# Patient Record
Sex: Female | Born: 1980 | Race: White | Hispanic: No | Marital: Married | Smoking: Never smoker
Health system: Southern US, Community
[De-identification: ages and names within clinical notes are randomized; demographics above are authoritative.]

## PROBLEM LIST (undated history)

## (undated) ENCOUNTER — Inpatient Hospital Stay (HOSPITAL_COMMUNITY): Payer: Self-pay

## (undated) DIAGNOSIS — F32A Depression, unspecified: Secondary | ICD-10-CM

## (undated) DIAGNOSIS — F429 Obsessive-compulsive disorder, unspecified: Secondary | ICD-10-CM

## (undated) DIAGNOSIS — F329 Major depressive disorder, single episode, unspecified: Secondary | ICD-10-CM

## (undated) DIAGNOSIS — O09299 Supervision of pregnancy with other poor reproductive or obstetric history, unspecified trimester: Secondary | ICD-10-CM

## (undated) DIAGNOSIS — O24419 Gestational diabetes mellitus in pregnancy, unspecified control: Secondary | ICD-10-CM

## (undated) DIAGNOSIS — F419 Anxiety disorder, unspecified: Secondary | ICD-10-CM

## (undated) HISTORY — DX: Gestational diabetes mellitus in pregnancy, unspecified control: O24.419

## (undated) HISTORY — PX: WISDOM TOOTH EXTRACTION: SHX21

## (undated) HISTORY — DX: Supervision of pregnancy with other poor reproductive or obstetric history, unspecified trimester: O09.299

## (undated) HISTORY — PX: NO PAST SURGERIES: SHX2092

---

## 2000-01-16 ENCOUNTER — Other Ambulatory Visit: Admission: RE | Admit: 2000-01-16 | Discharge: 2000-01-16 | Payer: Self-pay | Admitting: Obstetrics and Gynecology

## 2000-12-16 ENCOUNTER — Other Ambulatory Visit: Admission: RE | Admit: 2000-12-16 | Discharge: 2000-12-16 | Payer: Self-pay | Admitting: Obstetrics and Gynecology

## 2001-12-17 ENCOUNTER — Other Ambulatory Visit: Admission: RE | Admit: 2001-12-17 | Discharge: 2001-12-17 | Payer: Self-pay | Admitting: Obstetrics and Gynecology

## 2007-02-27 ENCOUNTER — Other Ambulatory Visit: Admission: RE | Admit: 2007-02-27 | Discharge: 2007-02-27 | Payer: Self-pay | Admitting: Internal Medicine

## 2009-04-14 ENCOUNTER — Other Ambulatory Visit: Admission: RE | Admit: 2009-04-14 | Discharge: 2009-04-14 | Payer: Self-pay | Admitting: Family Medicine

## 2010-05-14 NOTE — L&D Delivery Note (Signed)
Patient was C/C/+2 and pushed for 60 minutes without epidural.   NSVD  female infant, Apgars 5,8, weight P.   The patient had one 2nd degree midline lacerations repaired with 2-0 vicryl R. Fundus was firm. EBL was about 1000 cc despite aggressive uterine massage;  Controlled with 40 units pitocin and methergine Placenta was delivered intact. Vagina was clear.  Baby was stable but had periods of apnea c/w magnesium- transferred to [facility].  Kathryn [name]

## 2010-09-08 LAB — ANTIBODY SCREEN: Antibody Screen: NEGATIVE

## 2010-09-08 LAB — ABO/RH

## 2010-09-08 LAB — HIV ANTIBODY (ROUTINE TESTING W REFLEX): HIV: NONREACTIVE

## 2010-09-08 LAB — RUBELLA ANTIBODY, IGM: Rubella: IMMUNE

## 2010-11-07 ENCOUNTER — Other Ambulatory Visit (HOSPITAL_COMMUNITY): Payer: Self-pay | Admitting: Obstetrics and Gynecology

## 2010-11-07 DIAGNOSIS — O09899 Supervision of other high risk pregnancies, unspecified trimester: Secondary | ICD-10-CM

## 2010-11-10 ENCOUNTER — Other Ambulatory Visit (HOSPITAL_COMMUNITY): Payer: Self-pay | Admitting: Obstetrics and Gynecology

## 2010-11-10 ENCOUNTER — Encounter (HOSPITAL_COMMUNITY): Payer: Self-pay

## 2010-11-10 ENCOUNTER — Ambulatory Visit (HOSPITAL_COMMUNITY)
Admission: RE | Admit: 2010-11-10 | Discharge: 2010-11-10 | Disposition: A | Discharge status: Home / Self Care | Payer: BC Managed Care – PPO | Source: Ambulatory Visit | Attending: Obstetrics and Gynecology | Admitting: Obstetrics and Gynecology

## 2010-11-10 DIAGNOSIS — IMO0001 Reserved for inherently not codable concepts without codable children: Secondary | ICD-10-CM

## 2010-11-10 DIAGNOSIS — O09899 Supervision of other high risk pregnancies, unspecified trimester: Secondary | ICD-10-CM

## 2010-11-10 DIAGNOSIS — Z363 Encounter for antenatal screening for malformations: Secondary | ICD-10-CM | POA: Insufficient documentation

## 2010-11-10 DIAGNOSIS — Z1389 Encounter for screening for other disorder: Secondary | ICD-10-CM | POA: Insufficient documentation

## 2010-11-10 DIAGNOSIS — O2690 Pregnancy related conditions, unspecified, unspecified trimester: Secondary | ICD-10-CM

## 2010-11-10 DIAGNOSIS — O358XX0 Maternal care for other (suspected) fetal abnormality and damage, not applicable or unspecified: Secondary | ICD-10-CM | POA: Insufficient documentation

## 2010-12-21 ENCOUNTER — Encounter (HOSPITAL_COMMUNITY): Payer: Self-pay

## 2010-12-21 ENCOUNTER — Ambulatory Visit (HOSPITAL_COMMUNITY)
Admission: RE | Admit: 2010-12-21 | Discharge: 2010-12-21 | Disposition: A | Discharge status: Home / Self Care | Payer: BC Managed Care – PPO | Source: Ambulatory Visit | Attending: Obstetrics and Gynecology | Admitting: Obstetrics and Gynecology

## 2010-12-21 DIAGNOSIS — Q270 Congenital absence and hypoplasia of umbilical artery: Secondary | ICD-10-CM

## 2010-12-21 DIAGNOSIS — Z363 Encounter for antenatal screening for malformations: Secondary | ICD-10-CM | POA: Insufficient documentation

## 2010-12-21 DIAGNOSIS — O358XX0 Maternal care for other (suspected) fetal abnormality and damage, not applicable or unspecified: Secondary | ICD-10-CM | POA: Insufficient documentation

## 2010-12-21 DIAGNOSIS — O2690 Pregnancy related conditions, unspecified, unspecified trimester: Secondary | ICD-10-CM

## 2010-12-21 DIAGNOSIS — IMO0001 Reserved for inherently not codable concepts without codable children: Secondary | ICD-10-CM | POA: Insufficient documentation

## 2010-12-21 DIAGNOSIS — Z1389 Encounter for screening for other disorder: Secondary | ICD-10-CM | POA: Insufficient documentation

## 2010-12-21 NOTE — Progress Notes (Signed)
Ultrasound in AS/OBGYN/EPIC.  Follow up U/S scheduled 6 weeks

## 2011-01-26 ENCOUNTER — Institutional Professional Consult (permissible substitution): Payer: BC Managed Care – PPO | Admitting: Pediatrics

## 2011-02-01 ENCOUNTER — Ambulatory Visit (HOSPITAL_COMMUNITY)
Admission: RE | Admit: 2011-02-01 | Discharge: 2011-02-01 | Disposition: A | Discharge status: Home / Self Care | Payer: BC Managed Care – PPO | Source: Ambulatory Visit | Attending: Obstetrics and Gynecology | Admitting: Obstetrics and Gynecology

## 2011-02-01 DIAGNOSIS — IMO0001 Reserved for inherently not codable concepts without codable children: Secondary | ICD-10-CM | POA: Insufficient documentation

## 2011-02-01 DIAGNOSIS — Q270 Congenital absence and hypoplasia of umbilical artery: Secondary | ICD-10-CM

## 2011-02-01 DIAGNOSIS — Z3689 Encounter for other specified antenatal screening: Secondary | ICD-10-CM | POA: Insufficient documentation

## 2011-02-01 NOTE — Progress Notes (Signed)
Report in [facility]-OBGYN/EPIC; follow-up as needed 

## 2011-02-28 ENCOUNTER — Encounter (HOSPITAL_COMMUNITY): Payer: Self-pay | Admitting: Anesthesiology

## 2011-02-28 ENCOUNTER — Encounter (HOSPITAL_COMMUNITY): Payer: Self-pay | Admitting: *Deleted

## 2011-02-28 ENCOUNTER — Inpatient Hospital Stay (HOSPITAL_COMMUNITY)
Admission: AD | Admit: 2011-02-28 | Discharge: 2011-03-05 | DRG: 372 | Disposition: A | Discharge status: Home / Self Care | Payer: BC Managed Care – PPO | Source: Ambulatory Visit | Attending: Obstetrics and Gynecology | Admitting: Obstetrics and Gynecology

## 2011-02-28 DIAGNOSIS — Q270 Congenital absence and hypoplasia of umbilical artery: Secondary | ICD-10-CM

## 2011-02-28 DIAGNOSIS — O1414 Severe pre-eclampsia complicating childbirth: Principal | ICD-10-CM | POA: Diagnosis present

## 2011-02-28 DIAGNOSIS — O1420 HELLP syndrome (HELLP), unspecified trimester: Secondary | ICD-10-CM

## 2011-02-28 HISTORY — DX: Depression, unspecified: F32.A

## 2011-02-28 HISTORY — DX: Anxiety disorder, unspecified: F41.9

## 2011-02-28 HISTORY — DX: Major depressive disorder, single episode, unspecified: F32.9

## 2011-02-28 HISTORY — DX: Obsessive-compulsive disorder, unspecified: F42.9

## 2011-02-28 LAB — URINALYSIS, ROUTINE W REFLEX MICROSCOPIC
Glucose, UA: NEGATIVE mg/dL
Ketones, ur: NEGATIVE mg/dL
Nitrite: NEGATIVE
Specific Gravity, Urine: >1.03 — ABNORMAL HIGH (ref 1.005–1.030)
pH: 6 (ref 5.0–8.0)

## 2011-02-28 LAB — TYPE AND SCREEN: ABO/RH(D): A POS

## 2011-02-28 LAB — COMPREHENSIVE METABOLIC PANEL
Albumin: 2.5 g/dL — ABNORMAL LOW (ref 3.5–5.2)
BUN: 11 mg/dL (ref 6–23)
Calcium: 9.2 mg/dL (ref 8.4–10.5)
Creatinine, Ser: 0.76 mg/dL (ref 0.50–1.10)
GFR calc Af Amer: >90 mL/min (ref >90)
Glucose, Bld: 78 mg/dL (ref 70–99)
Potassium: 4.4 mEq/L (ref 3.5–5.1)
Total Protein: 6 g/dL (ref 6.0–8.3)

## 2011-02-28 LAB — AMYLASE: Amylase: 60 U/L (ref 0–105)

## 2011-02-28 LAB — CBC
HCT: 35.5 % — ABNORMAL LOW (ref 36.0–46.0)
Hemoglobin: 12.2 g/dL (ref 12.0–15.0)
MCH: 31.9 pg (ref 26.0–34.0)
MCHC: 34.4 g/dL (ref 30.0–36.0)
RDW: 13.2 % (ref 11.5–15.5)

## 2011-02-28 LAB — ABO/RH: ABO/RH(D): A POS

## 2011-02-28 LAB — URINE MICROSCOPIC-ADD ON

## 2011-02-28 LAB — LIPASE, BLOOD: Lipase: 28 U/L (ref 11–59)

## 2011-02-28 MED ORDER — OXYTOCIN 20 UNITS IN LACTATED RINGERS INFUSION - SIMPLE
INTRAVENOUS | Status: AC
  Administered 2011-02-28: 2 m[IU]/min via INTRAVENOUS
  Filled 2011-02-28: qty 1000

## 2011-02-28 MED ORDER — OXYTOCIN 20 UNITS IN LACTATED RINGERS INFUSION - SIMPLE
125.0000 mL/h | Freq: Once | INTRAVENOUS | Status: AC
  Administered 2011-03-01: 125 mL/h via INTRAVENOUS

## 2011-02-28 MED ORDER — LIDOCAINE HCL (PF) 1 % IJ SOLN: 30.0000 mL | INTRAMUSCULAR | Status: DC | PRN

## 2011-02-28 MED ORDER — IBUPROFEN 600 MG PO TABS: 600.0000 mg | ORAL_TABLET | Freq: Four times a day (QID) | ORAL | Status: DC | PRN

## 2011-02-28 MED ORDER — PENICILLIN G POTASSIUM 5000000 UNITS IJ SOLR
5.0000 10*6.[IU] | Freq: Once | INTRAVENOUS | Status: DC
  Administered 2011-02-28: 5 10*6.[IU] via INTRAVENOUS
  Filled 2011-02-28: qty 5

## 2011-02-28 MED ORDER — ZOLPIDEM TARTRATE 10 MG PO TABS: 10.0000 mg | ORAL_TABLET | Freq: Every evening | ORAL | Status: DC | PRN

## 2011-02-28 MED ORDER — PENICILLIN G POTASSIUM 5000000 UNITS IJ SOLR
5.0000 10*6.[IU] | Freq: Once | INTRAVENOUS | Status: DC
  Filled 2011-02-28: qty 5

## 2011-02-28 MED ORDER — OXYTOCIN BOLUS FROM INFUSION
500.0000 mL | Freq: Once | INTRAVENOUS | Status: DC
  Filled 2011-02-28: qty 500
  Filled 2011-02-28 (×2): qty 1000

## 2011-02-28 MED ORDER — CITRIC ACID-SODIUM CITRATE 334-500 MG/5ML PO SOLN: 30.0000 mL | ORAL | Status: DC | PRN

## 2011-02-28 MED ORDER — ONDANSETRON HCL 4 MG/2ML IJ SOLN: 4.0000 mg | Freq: Four times a day (QID) | INTRAMUSCULAR | Status: DC | PRN

## 2011-02-28 MED ORDER — LACTATED RINGERS IV SOLN
INTRAVENOUS | Status: DC
  Administered 2011-02-28: 20:00:00 via INTRAVENOUS

## 2011-02-28 MED ORDER — LACTATED RINGERS IV SOLN: 500.0000 mL | INTRAVENOUS | Status: DC | PRN

## 2011-02-28 MED ORDER — MAGNESIUM SULFATE BOLUS VIA INFUSION
6.0000 g | Freq: Once | INTRAVENOUS | Status: DC
  Filled 2011-02-28: qty 500

## 2011-02-28 MED ORDER — PENICILLIN G POTASSIUM 5000000 UNITS IJ SOLR
2.5000 10*6.[IU] | INTRAVENOUS | Status: DC
  Administered 2011-02-28 – 2011-03-01 (×3): 2.5 10*6.[IU] via INTRAVENOUS
  Filled 2011-02-28 (×8): qty 2.5

## 2011-02-28 MED ORDER — OXYTOCIN BOLUS FROM INFUSION
500.0000 mL | Freq: Once | INTRAVENOUS | Status: DC
  Filled 2011-02-28: qty 500

## 2011-02-28 MED ORDER — ACETAMINOPHEN 325 MG PO TABS: 650.0000 mg | ORAL_TABLET | ORAL | Status: DC | PRN

## 2011-02-28 MED ORDER — LACTATED RINGERS IV SOLN: INTRAVENOUS | Status: DC

## 2011-02-28 MED ORDER — OXYTOCIN 20 UNITS IN LACTATED RINGERS INFUSION - SIMPLE: 125.0000 mL/h | Freq: Once | INTRAVENOUS | Status: DC

## 2011-02-28 MED ORDER — FLEET ENEMA 7-19 GM/118ML RE ENEM: 1.0000 | ENEMA | RECTAL | Status: DC | PRN

## 2011-02-28 MED ORDER — MAGNESIUM SULFATE BOLUS VIA INFUSION
6.0000 g | Freq: Once | INTRAVENOUS | Status: DC
Start: 2011-02-28 — End: 2011-02-28
  Filled 2011-02-28: qty 500

## 2011-02-28 MED ORDER — MAGNESIUM SULFATE 40 G IN LACTATED RINGERS - SIMPLE
2.0000 g/h | INTRAVENOUS | Status: DC
  Administered 2011-03-01: 2 g/h via INTRAVENOUS
  Filled 2011-02-28: qty 500

## 2011-02-28 MED ORDER — NALBUPHINE SYRINGE 5 MG/0.5 ML: 10.0000 mg | INJECTION | INTRAMUSCULAR | Status: DC | PRN

## 2011-02-28 MED ORDER — PENICILLIN G POTASSIUM 5000000 UNITS IJ SOLR
2.5000 10*6.[IU] | INTRAVENOUS | Status: DC
  Filled 2011-02-28 (×2): qty 2.5

## 2011-02-28 MED ORDER — OXYTOCIN 20 UNITS IN LACTATED RINGERS INFUSION - SIMPLE
1.0000 m[IU]/min | INTRAVENOUS | Status: DC
  Administered 2011-02-28: 2 m[IU]/min via INTRAVENOUS
  Administered 2011-03-01: 333.333 m[IU]/min via INTRAVENOUS
  Administered 2011-03-01: 333 m[IU]/min via INTRAVENOUS
  Filled 2011-02-28: qty 1000

## 2011-02-28 MED ORDER — CITRIC ACID-SODIUM CITRATE 334-500 MG/5ML PO SOLN
30.0000 mL | ORAL | Status: DC | PRN
  Administered 2011-03-01: 30 mL via ORAL
  Filled 2011-02-28: qty 15

## 2011-02-28 MED ORDER — TERBUTALINE SULFATE 1 MG/ML IJ SOLN: 0.2500 mg | Freq: Once | INTRAMUSCULAR | Status: AC | PRN

## 2011-02-28 MED ORDER — MAGNESIUM SULFATE 40 G IN LACTATED RINGERS - SIMPLE
2.0000 g/h | INTRAVENOUS | Status: DC
  Administered 2011-02-28: 6 g/h via INTRAVENOUS
  Filled 2011-02-28: qty 500

## 2011-02-28 MED ORDER — GI COCKTAIL ~~LOC~~
30.0000 mL | Freq: Once | ORAL | Status: AC
  Administered 2011-02-28: 30 mL via ORAL
  Filled 2011-02-28: qty 30

## 2011-02-28 MED ORDER — LIDOCAINE HCL (PF) 1 % IJ SOLN
30.0000 mL | INTRAMUSCULAR | Status: AC | PRN
  Administered 2011-03-01: 30 mL via SUBCUTANEOUS
  Filled 2011-02-28: qty 30

## 2011-02-28 MED ORDER — LACTATED RINGERS IV SOLN
500.0000 mL | INTRAVENOUS | Status: DC | PRN
  Administered 2011-03-01: 1000 mL via INTRAVENOUS

## 2011-02-28 NOTE — ED Provider Notes (Signed)
History   Pt presents today c/o epigastric pain that comes and goes. She states she has had this for the past couple of weeks and it is always located in the mid-epigastric region. She denies CP, SOB, fever, decreased fetal movement, or any other sx at this time. She was seen in the office by Dr. Henderson Cloud earlier today.  Chief Complaint  Patient presents with  . Abdominal Pain   HPI  OB History    Grav Para Term Preterm Abortions TAB SAB Ect Mult Living   1               Past Medical History  Diagnosis Date  . Anxiety   . Obsessive compulsive disorder   . Depression     Past Surgical History  Procedure Date  . Wisdom tooth extraction     No family history on file.  History  Substance Use Topics  . Smoking status: Never Smoker   . Smokeless tobacco: Never Used  . Alcohol Use: No    Allergies:  Allergies  Allergen Reactions  . Naproxen Other (See Comments)    Stomach pain  . Percocet (Oxycodone-Acetaminophen) Itching  . Sulfa Antibiotics Other (See Comments)    Childhood reaction    Prescriptions prior to admission  Medication Sig Dispense Refill  . citalopram (CELEXA) 10 MG tablet Take 20 mg by mouth daily.       . famotidine (PEPCID AC MAXIMUM STRENGTH) 20 MG tablet Take 20 mg by mouth 2 (two) times daily.       . Prenatal Vit-Fe Fum-FA-Omega (ONE-A-DAY WOMENS PRENATAL PO) Take 1 tablet by mouth daily.          Review of Systems  Constitutional: Negative for fever.  Eyes: Negative for blurred vision.  Respiratory: Negative for cough, hemoptysis, sputum production, shortness of breath and wheezing.   Cardiovascular: Negative for chest pain, palpitations, orthopnea and claudication.  Gastrointestinal: Positive for abdominal pain. Negative for nausea, vomiting, diarrhea and constipation.  Genitourinary: Negative for dysuria, urgency, frequency and hematuria.  Neurological: Negative for dizziness and headaches.  Psychiatric/Behavioral: Negative for depression  and suicidal ideas.   Physical Exam   Blood pressure 110/74, pulse 59, temperature 98.7 F (37.1 C), temperature source Oral, resp. rate 20, height 5\' 4"  (1.626 m), weight 172 lb 3.2 oz (78.109 kg), SpO2 97.00%.  Physical Exam  Nursing note and vitals reviewed. Constitutional: She is oriented to person, place, and time. She appears well-developed and well-nourished. No distress.  HENT:  Head: Normocephalic and atraumatic.  Eyes: EOM are normal. Pupils are equal, round, and reactive to light.  Cardiovascular: Normal rate, regular rhythm and normal heart sounds.  Exam reveals no gallop and no friction rub.   No murmur heard. Respiratory: Effort normal and breath sounds normal. No respiratory distress. She has no wheezes. She has no rales. She exhibits no tenderness.  GI: Soft. She exhibits no distension. There is no tenderness. There is no rebound and no guarding.  Neurological: She is alert and oriented to person, place, and time.  Skin: Skin is warm and dry. She is not diaphoretic.  Psychiatric: She has a normal mood and affect. Her behavior is normal. Judgment and thought content normal.    MAU Course  Procedures  Results for orders placed during the hospital encounter of 02/28/11 (from the past 24 hour(s))  CBC     Status: Abnormal   Collection Time   02/28/11  6:05 PM      Component Value Range  WBC 9.6  4.0 - 10.5 (K/uL)   RBC 3.82 (*) 3.87 - 5.11 (MIL/uL)   Hemoglobin 12.2  12.0 - 15.0 (g/dL)   HCT 69.6 (*) 29.5 - 46.0 (%)   MCV 92.9  78.0 - 100.0 (fL)   MCH 31.9  26.0 - 34.0 (pg)   MCHC 34.4  30.0 - 36.0 (g/dL)   RDW 28.4  13.2 - 44.0 (%)   Platelets 44 (*) 150 - 400 (K/uL)  COMPREHENSIVE METABOLIC PANEL     Status: Abnormal   Collection Time   02/28/11  6:05 PM      Component Value Range   Sodium 134 (*) 135 - 145 (mEq/L)   Potassium 4.4  3.5 - 5.1 (mEq/L)   Chloride 102  96 - 112 (mEq/L)   CO2 25  19 - 32 (mEq/L)   Glucose, Bld 78  70 - 99 (mg/dL)   BUN 11  6  - 23 (mg/dL)   Creatinine, Ser 1.02  0.50 - 1.10 (mg/dL)   Calcium 9.2  8.4 - 72.5 (mg/dL)   Total Protein 6.0  6.0 - 8.3 (g/dL)   Albumin 2.5 (*) 3.5 - 5.2 (g/dL)   AST 366 (*) 0 - 37 (U/L)   ALT 270 (*) 0 - 35 (U/L)   Alkaline Phosphatase 132 (*) 39 - 117 (U/L)   Total Bilirubin 0.5  0.3 - 1.2 (mg/dL)   GFR calc non Af Amer >90  >90 (mL/min)   GFR calc Af Amer >90  >90 (mL/min)    Discussed with Dr. Tenny Craw. Will admit for delivery. Assessment and Plan  HELLP: admit.  Clinton Gallant. Rice III, DrHSc, MPAS, PA-C  02/28/2011, 6:37 PM   Henrietta Hoover, PA 02/28/11 1908

## 2011-02-28 NOTE — H&P (Addendum)
Kathryn [name] is a 30 y.o. G1P0  female at 35+4 presenting for epigastric pain One week ago the patient noted acute onset of central epigastric pain and chest pain.  These symptoms eventually subsided but she has had recurrent episodes of epigastric pain on/off since then.  She was experiencing that again today and was seen for a routine appointment earlier today. She was sent to MAU for evaluation.  In [place] BPs were normotensive, however her LFTs were elevated and her platelets were 44K c/w HELLP Syndrome. Pt is also noted to be contracting and her cervix was 3 cm in the office.. She is admitted for augmentation of labor for HELLP.  History OB History    Grav Para Term Preterm Abortions TAB SAB Ect Mult Living   1              Past Medical History  Diagnosis Date  . Anxiety   . Obsessive compulsive disorder   . Depression    Past Surgical History  Procedure Date  . Wisdom tooth extraction    Family History: family history is not on file. Social History:  reports that she has never smoked. She has never used smokeless tobacco. She reports that she does not drink alcohol or use illicit drugs.  ROS: As above  Filed Vitals:   02/28/11 2000  BP: 126/79  Pulse: 68  Temp: 98.5 F (36.9 C)  Resp: 20     Exam Physical Exam   AOX3, NAD Gravid, soft, NT/ND FHT 140-150 + accels, no decels, reactive Cvx: 3/80/-2 Toco: Q 2-3minutes  Prenatal labs: ABO, Rh:  A+ Antibody:  Negative Rubella:  Immune RPR:   NR HBsAg:   Neg HIV:   NR GBS:   unknown  Pregnancy has been complicated by 2VC.  Fetal Echo was WNL as was an anatomic survey.  Serial growth ultrasounds have shown good fetal growth.  Pt has also had depression and anxiety for which she is currently taking celexa 20mg .  Results for JADAH, BOBAK (MRN [id number]) as of 02/28/2011 20:33  Ref. Range 02/28/2011 18:05  Sodium Latest Range: 135-145 mEq/L 134 (L)  Potassium Latest Range: 3.5-5.1 mEq/L 4.4  Chloride Latest  Range: 96-112 mEq/L 102  CO2 Latest Range: 19-32 mEq/L 25  BUN Latest Range: 6-23 mg/dL 11  Creat Latest Range: 0.50-1.10 mg/dL 4.09  Calcium Latest Range: 8.4-10.5 mg/dL 9.2  GFR calc non Af Amer Latest Range: >90 mL/min >90  GFR calc Af Amer Latest Range: >90 mL/min >90  Glucose Latest Range: 70-99 mg/dL 78  Alkaline Phosphatase Latest Range: 39-117 U/L 132 (H)  Albumin Latest Range: 3.5-5.2 g/dL 2.5 (L)  Amylase Latest Range: 0-105 U/L 60  Lipase Latest Range: 11-59 U/L 28  AST Latest Range: 0-37 U/L 189 (H)  ALT Latest Range: 0-35 U/L 270 (H)  Total Protein Latest Range: 6.0-8.3 g/dL 6.0  Total Bilirubin Latest Range: 0.3-1.2 mg/dL 0.5    Assessment/Plan: 30 yo G1P0 @ 35+4 for augmentation of labor for HELLP 1) Admit 2) Magnesium Sulfate for seizure prophylaxis 3) Pitocin for augmentation of labor 4) PCN for unknown GBS   Kamia Insalaco H. 02/28/2011, 8:21 PM

## 2011-02-28 NOTE — Progress Notes (Signed)
Doula at bedside.

## 2011-02-28 NOTE — Progress Notes (Signed)
Report called to birthing suites charge RN per Erenest Blank, RN. Pt may go to room 165 in 5-10 min.

## 2011-02-28 NOTE — Progress Notes (Signed)
Pt states that last week on Wednesday and Thursday she had epigastric pain that went away. Started again this am and had the patient in tears. Pain is intense and radiates into the back. Some nausea but no vomiting. No bleeding or leaking and reports good fetal movement.

## 2011-02-28 NOTE — Anesthesia Preprocedure Evaluation (Signed)
Anesthesia Evaluation  Name, MR# and DOB Patient awake  General Assessment Comment  Reviewed: Allergy & Precautions, H&P , NPO status , Patient's Chart, lab work & pertinent test results  Airway Mallampati: II TM Distance: >3 FB Neck ROM: Full    Dental No notable dental hx. (+) Teeth Intact and Caps   Pulmonary  clear to auscultation  Pulmonary exam normal       Cardiovascular Regular Normal    Neuro/Psych PSYCHIATRIC DISORDERS Anxiety Depression OCDNegative Neurological ROS     GI/Hepatic GERD Medicated and Poorly ControlledElevated LFT's today. No prior hx/o liver problems. GERD with pregnancy. Poor control on Pepcid AC   Endo/Other  Negative Endocrine ROS  Renal/GU negative Renal ROS  Genitourinary negative   Musculoskeletal negative musculoskeletal ROS (+)   Abdominal (+)  Abdomen: soft.    Peds negative pediatric ROS (+)  Hematology negative hematology ROS (+)   Anesthesia Other Findings   Reproductive/Obstetrics (+) Pregnancy                           Anesthesia Physical Anesthesia Plan  ASA: III  Anesthesia Plan: General   Post-op Pain Management:    Induction: Intravenous, Rapid sequence and Cricoid pressure planned  Airway Management Planned: Oral ETT  Additional Equipment:   Intra-op Plan:   Post-operative Plan: Extubation in OR  Informed Consent:   Dental advisory given  Plan Discussed with: CRNA, Anesthesiologist and Surgeon  Anesthesia Plan Comments: (Discussed GA with patient in detail, if C/Section occurs. Patient's plan is to deliver vaginally. Risks, benefits and alternatives of General Anesthesia discussed in detail with the patient and her husband. They appear to understand. Questions were answered.)        Anesthesia Quick Evaluation

## 2011-02-28 NOTE — Progress Notes (Signed)
Pt to room 165 at this time.

## 2011-03-01 ENCOUNTER — Encounter (HOSPITAL_COMMUNITY): Payer: Self-pay

## 2011-03-01 LAB — MAGNESIUM: Magnesium: 5.9 mg/dL — ABNORMAL HIGH (ref 1.5–2.5)

## 2011-03-01 LAB — CBC
HCT: 35.8 % — ABNORMAL LOW (ref 36.0–46.0)
HCT: 28.4 % — ABNORMAL LOW (ref 36.0–46.0)
Hemoglobin: 9.7 g/dL — ABNORMAL LOW (ref 12.0–15.0)
MCHC: 34.2 g/dL (ref 30.0–36.0)
RBC: 3.08 MIL/uL — ABNORMAL LOW (ref 3.87–5.11)
RDW: 13.5 % (ref 11.5–15.5)
WBC: 10.5 10*3/uL (ref 4.0–10.5)

## 2011-03-01 LAB — RPR: RPR Ser Ql: NONREACTIVE

## 2011-03-01 LAB — BASIC METABOLIC PANEL
BUN: 8 mg/dL (ref 6–23)
Chloride: 99 mEq/L (ref 96–112)
GFR calc Af Amer: >90 mL/min (ref >90)
Potassium: 4.2 mEq/L (ref 3.5–5.1)
Sodium: 132 mEq/L — ABNORMAL LOW (ref 135–145)

## 2011-03-01 LAB — COMPREHENSIVE METABOLIC PANEL
ALT: 344 U/L — ABNORMAL HIGH (ref 0–35)
Alkaline Phosphatase: 109 U/L (ref 39–117)
BUN: 7 mg/dL (ref 6–23)
CO2: 24 mEq/L (ref 19–32)
Chloride: 98 mEq/L (ref 96–112)
GFR calc Af Amer: >90 mL/min (ref >90)
GFR calc non Af Amer: >90 mL/min (ref >90)
Glucose, Bld: 130 mg/dL — ABNORMAL HIGH (ref 70–99)
Potassium: 3.9 mEq/L (ref 3.5–5.1)
Sodium: 130 mEq/L — ABNORMAL LOW (ref 135–145)
Total Bilirubin: 1 mg/dL (ref 0.3–1.2)

## 2011-03-01 LAB — MRSA PCR SCREENING: MRSA by PCR: NEGATIVE

## 2011-03-01 MED ORDER — SODIUM CHLORIDE 0.9 % IV SOLN
2.0000 g | Freq: Four times a day (QID) | INTRAVENOUS | Status: DC
  Administered 2011-03-01 – 2011-03-03 (×6): 2 g via INTRAVENOUS
  Filled 2011-03-01 (×8): qty 2000

## 2011-03-01 MED ORDER — PRENATAL PLUS 27-1 MG PO TABS
1.0000 | ORAL_TABLET | Freq: Every day | ORAL | Status: DC
  Administered 2011-03-02 – 2011-03-05 (×4): 1 via ORAL
  Filled 2011-03-01 (×4): qty 1

## 2011-03-01 MED ORDER — BENZOCAINE-MENTHOL 20-0.5 % EX AERO: 1.0000 "application " | INHALATION_SPRAY | CUTANEOUS | Status: DC | PRN

## 2011-03-01 MED ORDER — SODIUM CHLORIDE 0.9 % IV SOLN: 250.0000 mL | INTRAVENOUS | Status: DC

## 2011-03-01 MED ORDER — WITCH HAZEL-GLYCERIN EX PADS: 1.0000 "application " | MEDICATED_PAD | CUTANEOUS | Status: DC | PRN

## 2011-03-01 MED ORDER — METHYLERGONOVINE MALEATE 0.2 MG/ML IJ SOLN
INTRAMUSCULAR | Status: AC
  Administered 2011-03-01: 0.2 mg
  Filled 2011-03-01: qty 1

## 2011-03-01 MED ORDER — ZOLPIDEM TARTRATE 5 MG PO TABS: 5.0000 mg | ORAL_TABLET | Freq: Every evening | ORAL | Status: DC | PRN

## 2011-03-01 MED ORDER — FAMOTIDINE 20 MG PO TABS: 20.0000 mg | ORAL_TABLET | Freq: Two times a day (BID) | ORAL | Status: DC

## 2011-03-01 MED ORDER — SODIUM CHLORIDE 0.9 % IJ SOLN: 3.0000 mL | Freq: Two times a day (BID) | INTRAMUSCULAR | Status: DC

## 2011-03-01 MED ORDER — TETANUS-DIPHTH-ACELL PERTUSSIS 5-2.5-18.5 LF-MCG/0.5 IM SUSP
0.5000 mL | Freq: Once | INTRAMUSCULAR | Status: AC
  Administered 2011-03-02: 0.5 mL via INTRAMUSCULAR
  Filled 2011-03-01: qty 0.5

## 2011-03-01 MED ORDER — DIPHENHYDRAMINE HCL 25 MG PO CAPS: 25.0000 mg | ORAL_CAPSULE | Freq: Four times a day (QID) | ORAL | Status: DC | PRN

## 2011-03-01 MED ORDER — ACETAMINOPHEN 325 MG PO TABS
650.0000 mg | ORAL_TABLET | ORAL | Status: DC | PRN
  Administered 2011-03-01 – 2011-03-03 (×2): 650 mg via ORAL
  Filled 2011-03-01: qty 2
  Filled 2011-03-01: qty 1
  Filled 2011-03-01: qty 2

## 2011-03-01 MED ORDER — OXYCODONE-ACETAMINOPHEN 5-325 MG PO TABS: 1.0000 | ORAL_TABLET | ORAL | Status: DC | PRN

## 2011-03-01 MED ORDER — METOCLOPRAMIDE HCL 5 MG/5ML PO SOLN
5.0000 mg | Freq: Once | ORAL | Status: DC | PRN
  Filled 2011-03-01: qty 5

## 2011-03-01 MED ORDER — ONDANSETRON HCL 4 MG/2ML IJ SOLN: 4.0000 mg | INTRAMUSCULAR | Status: DC | PRN

## 2011-03-01 MED ORDER — CITALOPRAM HYDROBROMIDE 20 MG PO TABS
20.0000 mg | ORAL_TABLET | Freq: Every day | ORAL | Status: DC
  Administered 2011-03-01 – 2011-03-05 (×5): 20 mg via ORAL
  Filled 2011-03-01 (×7): qty 1

## 2011-03-01 MED ORDER — IBUPROFEN 800 MG PO TABS
800.0000 mg | ORAL_TABLET | Freq: Three times a day (TID) | ORAL | Status: DC
  Administered 2011-03-04 – 2011-03-05 (×2): 800 mg via ORAL
  Filled 2011-03-01 (×2): qty 1

## 2011-03-01 MED ORDER — MAGNESIUM HYDROXIDE 400 MG/5ML PO SUSP
30.0000 mL | ORAL | Status: DC | PRN
  Administered 2011-03-01: 30 mL via ORAL
  Filled 2011-03-01: qty 30

## 2011-03-01 MED ORDER — MAGNESIUM SULFATE 40 G IN LACTATED RINGERS - SIMPLE
2.0000 g/h | INTRAVENOUS | Status: DC
  Administered 2011-03-02: 2 g/h via INTRAVENOUS
  Filled 2011-03-01 (×2): qty 500

## 2011-03-01 MED ORDER — SIMETHICONE 80 MG PO CHEW: 80.0000 mg | CHEWABLE_TABLET | ORAL | Status: DC | PRN

## 2011-03-01 MED ORDER — FAMOTIDINE 20 MG PO TABS
20.0000 mg | ORAL_TABLET | Freq: Two times a day (BID) | ORAL | Status: DC
  Administered 2011-03-01 – 2011-03-05 (×8): 20 mg via ORAL
  Filled 2011-03-01 (×8): qty 1

## 2011-03-01 MED ORDER — OXYTOCIN 10 UNIT/ML IJ SOLN
INTRAMUSCULAR | Status: AC
  Administered 2011-03-01: 13:00:00
  Filled 2011-03-01: qty 2

## 2011-03-01 MED ORDER — SENNOSIDES-DOCUSATE SODIUM 8.6-50 MG PO TABS
2.0000 | ORAL_TABLET | Freq: Every day | ORAL | Status: DC
  Administered 2011-03-02: 2 via ORAL
  Administered 2011-03-03 – 2011-03-04 (×2): 1 via ORAL

## 2011-03-01 MED ORDER — OXYTOCIN 20 UNITS IN LACTATED RINGERS INFUSION - SIMPLE
125.0000 mL/h | INTRAVENOUS | Status: DC | PRN
  Administered 2011-03-02: 125 mL/h via INTRAVENOUS
  Filled 2011-03-01 (×2): qty 1000

## 2011-03-01 MED ORDER — LANOLIN HYDROUS EX OINT: TOPICAL_OINTMENT | CUTANEOUS | Status: DC | PRN

## 2011-03-01 MED ORDER — OXYTOCIN 10 UNIT/ML IJ SOLN
INTRAMUSCULAR | Status: AC
  Administered 2011-03-01: 40 [IU] via INTRAVASCULAR
  Filled 2011-03-01: qty 2

## 2011-03-01 MED ORDER — FERROUS SULFATE 325 (65 FE) MG PO TABS
325.0000 mg | ORAL_TABLET | Freq: Two times a day (BID) | ORAL | Status: DC
  Administered 2011-03-02 – 2011-03-05 (×6): 325 mg via ORAL
  Filled 2011-03-01 (×7): qty 1

## 2011-03-01 MED ORDER — ONDANSETRON HCL 4 MG PO TABS: 4.0000 mg | ORAL_TABLET | ORAL | Status: DC | PRN

## 2011-03-01 MED ORDER — MEASLES, MUMPS & RUBELLA VAC ~~LOC~~ INJ: 0.5000 mL | INJECTION | Freq: Once | SUBCUTANEOUS | Status: DC

## 2011-03-01 MED ORDER — FAMOTIDINE 40 MG/5ML PO SUSR
20.0000 mg | Freq: Once | ORAL | Status: DC | PRN
  Filled 2011-03-01: qty 2.5

## 2011-03-01 MED ORDER — METHYLERGONOVINE MALEATE 0.2 MG/ML IJ SOLN: 0.2000 mg | INTRAMUSCULAR | Status: DC | PRN

## 2011-03-01 MED ORDER — SODIUM CHLORIDE 0.9 % IJ SOLN
3.0000 mL | INTRAMUSCULAR | Status: DC | PRN
  Administered 2011-03-01: 3 mL via INTRAVENOUS

## 2011-03-01 MED ORDER — DIBUCAINE 1 % RE OINT: 1.0000 "application " | TOPICAL_OINTMENT | RECTAL | Status: DC | PRN

## 2011-03-01 NOTE — Progress Notes (Signed)
30 y.o. G1P0 [redacted]w[redacted]d  With HELLP.  Pt comfortable today with less chest pain and tolerating contractions.    Filed Vitals:   03/01/11 0601 03/01/11 0631 03/01/11 0701 03/01/11 0747  BP: 106/68 117/72 118/65 119/70  Pulse: 71 81 72 73  Temp:    98.2 F (36.8 C)  TempSrc:    Oral  Resp: 18 18 20 18   Height:      Weight:      SpO2:       SVE 4/C/-2 AROM clear FHTs 120s, gstv, NST R Toco q3 min  Lab Results  Component Value Date   WBC 10.5 03/01/2011   HGB 12.2 03/01/2011   HCT 35.8* 03/01/2011   MCV 92.5 03/01/2011   PLT 29* 03/01/2011   CMET P  A:  [redacted]w[redacted]d  With HELLP, induction. P:  1.  Two IV sites obtained.  Pt type and crossed.  House coverage checking on availability of platelets.       2.  Induction via pitocin and AROM.  Pain control with stadol only if needed.  Pt understands that she is not candidate for any regional anesthesia and will need general if surgery is needed.   Zakyria Metzinger A

## 2011-03-01 NOTE — Progress Notes (Signed)
Delivery of live viable female by Dr Henderson Cloud, Melvyn Novas 5,9 by Dr Gerarda Gunther

## 2011-03-01 NOTE — Progress Notes (Signed)
NICU at bedside for delivery

## 2011-03-01 NOTE — Progress Notes (Signed)
Left lateral position. Pt asleep

## 2011-03-01 NOTE — Progress Notes (Signed)
Transferred to AICU 371 report given to K. Michail Jewels, rn

## 2011-03-01 NOTE — Consult Note (Signed)
Neonatology Note:   Attendance at Delivery:    I was asked to attend this NSVD at 35 5/[redacted] weeks GA. The mother is a G1P0 A pos, GBS unknown with a history of OCD, anxiety, and depression. She was on magnesium for about 12 hours PTD for seizure prophylaxis, and was induced due to HELLP. She also received Pen G for 12 hours PTD. AROM 4 hours before delivery, fluid clear. Infant delivered rapidly and was placed on the mother's abdomen immediately after birth. He cried weakly and was floppy; I assessed him while on the mother's chest, noting borderline HR about 90-100 and irregular respirations at about 2 minutes of age. Moved him to the radiant warmer and gave stimulation, which got him crying and his breath sounds cleared up nicely. His color and perfusion also improved and the HR was stable at > 120 after that. We placed a pulse oximeter on him and it read 94% in room air. I observed him until 10 minutes of life, at which time he continued to have less than normal tone, but regular breathing, good color and perfusion. I spoke with the parents and with the OB nurse taking care of the baby to emphasize that, if he should have any problems, to call me or take him to CN for evaluation. I believe he is fine for central nursery care, but as he is premature, this could change. Ap 5/8. Exam: Lungs clear to ausc, no distress, bruised face with a few petechiae on forehead, slightly decreased muscle tone throughout. To CN to care of Pediatrician.   Deatra James, MD

## 2011-03-01 NOTE — Progress Notes (Signed)
Per Dr Henderson Cloud

## 2011-03-01 NOTE — Consult Note (Signed)
Neonatology Consult to Antenatal Patient:  Ms. Kathryn [name] was admitted 10/17 with a diagnosis of HELLP at 35 4/[redacted] weeks GA. She is currently being induced and is on magnesium sulfate for elevated BP.   I spoke with the patient, her husband, and 2 other family members. Our discussion was somewhat fragmented as the patient was having frequent contractions during this time. We discussed usual DR management at this GA, possible respiratory complications and need for support, IV access, feedings (mother desires breast feeding), LOS, Mortality and Morbidity. Parents requested no bath, deferral of Hep B vaccine; they had questions about ability to do skin to skin time after delivery. I let them know that, if the baby is having no respiratory problems, he may be able to go to the regular nursery; if he has resp problems in the DR, he may need NICU care.  If the family has more questions later, I would be glad to come back.  Thank you for asking me to see this patient.  Deatra James, MD Neonatologist  Time spent: [phone]

## 2011-03-01 NOTE — Progress Notes (Signed)
Notified Dr Henderson Cloud of pt platelet ct of 29, provider on way to assess

## 2011-03-01 NOTE — Progress Notes (Signed)
Provider made aware of patient status. FHT, uterine contraction pattern, SVE. Cont current POC. New orders given.

## 2011-03-02 LAB — CBC
HCT: 21.2 % — ABNORMAL LOW (ref 36.0–46.0)
Hemoglobin: 6.9 g/dL — CL (ref 12.0–15.0)
Hemoglobin: 7.3 g/dL — ABNORMAL LOW (ref 12.0–15.0)
MCH: 31.5 pg (ref 26.0–34.0)
MCH: 32 pg (ref 26.0–34.0)
MCHC: 34.4 g/dL (ref 30.0–36.0)
MCV: 92.7 fL (ref 78.0–100.0)
Platelets: 24 10*3/uL — CL (ref 150–400)
RBC: 2.19 MIL/uL — ABNORMAL LOW (ref 3.87–5.11)
RBC: 2.28 MIL/uL — ABNORMAL LOW (ref 3.87–5.11)
WBC: 11.4 10*3/uL — ABNORMAL HIGH (ref 4.0–10.5)

## 2011-03-02 LAB — COMPREHENSIVE METABOLIC PANEL
ALT: 377 U/L — ABNORMAL HIGH (ref 0–35)
AST: 291 U/L — ABNORMAL HIGH (ref 0–37)
Albumin: 1.6 g/dL — ABNORMAL LOW (ref 3.5–5.2)
Alkaline Phosphatase: 99 U/L (ref 39–117)
BUN: 7 mg/dL (ref 6–23)
BUN: 6 mg/dL (ref 6–23)
CO2: 29 mEq/L (ref 19–32)
Calcium: 6.6 mg/dL — ABNORMAL LOW (ref 8.4–10.5)
Creatinine, Ser: 0.7 mg/dL (ref 0.50–1.10)
GFR calc Af Amer: >90 mL/min (ref >90)
GFR calc non Af Amer: >90 mL/min (ref >90)
Glucose, Bld: 103 mg/dL — ABNORMAL HIGH (ref 70–99)
Potassium: 4 mEq/L (ref 3.5–5.1)
Sodium: 135 mEq/L (ref 135–145)
Total Bilirubin: 0.7 mg/dL (ref 0.3–1.2)

## 2011-03-02 LAB — MAGNESIUM: Magnesium: 5.4 mg/dL — ABNORMAL HIGH (ref 1.5–2.5)

## 2011-03-02 LAB — LACTATE DEHYDROGENASE: LDH: 694 U/L — ABNORMAL HIGH (ref 94–250)

## 2011-03-02 MED ORDER — LACTATED RINGERS IV SOLN
INTRAVENOUS | Status: DC
  Administered 2011-03-02: 10:00:00 via INTRAVENOUS

## 2011-03-02 MED ORDER — BENZOCAINE-MENTHOL 20-0.5 % EX AERO
INHALATION_SPRAY | CUTANEOUS | Status: AC
  Administered 2011-03-02: 04:00:00
  Filled 2011-03-02: qty 56

## 2011-03-02 MED ORDER — DOCUSATE SODIUM 100 MG PO CAPS
100.0000 mg | ORAL_CAPSULE | Freq: Two times a day (BID) | ORAL | Status: DC | PRN
  Administered 2011-03-02: 100 mg via ORAL
  Filled 2011-03-02: qty 1

## 2011-03-02 NOTE — Progress Notes (Signed)
SW consult received for "babies who have drug screen sent." SW reviewed babies chart and there has not been any drug screens ordered.  Therefore, SW has screened out this referral as an error.  SW will only see if appropriate consult is ordered. 

## 2011-03-02 NOTE — Progress Notes (Signed)
PPD#1 Pt alert and oriented. States that she became dizzy when standing.  Hgb was 6.9 this am.  Still on MgSo4.  Imp/ S/P SVD with HELLP.  Plan/ Repeat labs at noon.          Continue Mag.

## 2011-03-02 NOTE — Anesthesia Postprocedure Evaluation (Signed)
  Anesthesia Post-op Note  Patient: Kathryn [name]  Procedure(s) Performed: * No surgery performed*  Patient Location: PACU and A-ICU  Anesthesia Type: None  Level of Consciousness: awake, alert  and oriented  Airway and Oxygen Therapy: Patient Spontanous Breathing  Post-op Pain: none  Post-op Assessment: Post-op Vital signs reviewed, Patient's Cardiovascular Status Stable, Respiratory Function Stable, Patent Airway, No signs of Nausea or vomiting, Adequate PO intake and Pain level controlled  Post-op Vital Signs: Reviewed and stable  Complications: none

## 2011-03-02 NOTE — Progress Notes (Signed)
Pt states feeling fine, sitting upright in bed and breastfeeding

## 2011-03-02 NOTE — Progress Notes (Signed)

## 2011-03-02 NOTE — Progress Notes (Signed)
Notified Dr Henderson Cloud of pt labs, orthostatic b/p's, good urine output, and no active bleeding. Will continue to monitor pt.

## 2011-03-02 NOTE — Progress Notes (Signed)
UR chart review completed.  

## 2011-03-03 LAB — CBC
MCH: 32.2 pg (ref 26.0–34.0)
MCHC: 33.7 g/dL (ref 30.0–36.0)
Platelets: 48 10*3/uL — ABNORMAL LOW (ref 150–400)
RDW: 14.3 % (ref 11.5–15.5)

## 2011-03-03 LAB — COMPREHENSIVE METABOLIC PANEL
ALT: 252 U/L — ABNORMAL HIGH (ref 0–35)
AST: 73 U/L — ABNORMAL HIGH (ref 0–37)
Albumin: 1.7 g/dL — ABNORMAL LOW (ref 3.5–5.2)
Alkaline Phosphatase: 107 U/L (ref 39–117)
Calcium: 7.5 mg/dL — ABNORMAL LOW (ref 8.4–10.5)
GFR calc Af Amer: >90 mL/min (ref >90)
Potassium: 4 mEq/L (ref 3.5–5.1)
Sodium: 138 mEq/L (ref 135–145)
Total Protein: 4.1 g/dL — ABNORMAL LOW (ref 6.0–8.3)

## 2011-03-03 NOTE — Progress Notes (Signed)
Lab called to report critical hemoglobin of 6.7.  Dr. Dareen Piano called and made aware; pt stable and asymptomatic.  Also reported that pt has passed 3 clots (less than golf ball size) this morning after having been in bed all night without voiding.  No orders given.  Pt educated to void every 2 hours and to report clotting. Will continue to closely monitor.

## 2011-03-03 NOTE — Progress Notes (Signed)
Pt without complaints. States feels much better. Labs improving. Pt diuresing. VSSAF IMP/ S/P SVD with HEELP improving PLAN/ transfer to floor.

## 2011-03-04 LAB — COMPREHENSIVE METABOLIC PANEL
ALT: 188 U/L — ABNORMAL HIGH (ref 0–35)
AST: 59 U/L — ABNORMAL HIGH (ref 0–37)
Albumin: 2.1 g/dL — ABNORMAL LOW (ref 3.5–5.2)
Alkaline Phosphatase: 151 U/L — ABNORMAL HIGH (ref 39–117)
Calcium: 8.9 mg/dL (ref 8.4–10.5)
GFR calc Af Amer: >90 mL/min (ref >90)
Potassium: 3.6 mEq/L (ref 3.5–5.1)
Sodium: 139 mEq/L (ref 135–145)
Total Protein: 5.1 g/dL — ABNORMAL LOW (ref 6.0–8.3)

## 2011-03-04 LAB — DIFFERENTIAL
Basophils Absolute: 0 10*3/uL (ref 0.0–0.1)
Eosinophils Absolute: 0.3 10*3/uL (ref 0.0–0.7)
Lymphs Abs: 1.6 10*3/uL (ref 0.7–4.0)
Monocytes Absolute: 0.5 10*3/uL (ref 0.1–1.0)
Neutrophils Relative %: 77 % (ref 43–77)

## 2011-03-04 LAB — CBC
MCH: 33 pg (ref 26.0–34.0)
MCV: 97.1 fL (ref 78.0–100.0)
Platelets: 107 10*3/uL — ABNORMAL LOW (ref 150–400)
RBC: 2.06 MIL/uL — ABNORMAL LOW (ref 3.87–5.11)
RDW: 14 % (ref 11.5–15.5)
WBC: 10.4 10*3/uL (ref 4.0–10.5)

## 2011-03-05 ENCOUNTER — Encounter (HOSPITAL_COMMUNITY)
Admission: RE | Admit: 2011-03-05 | Discharge: 2011-03-05 | Disposition: A | Discharge status: Home / Self Care | Payer: BC Managed Care – PPO | Source: Ambulatory Visit | Attending: Obstetrics and Gynecology | Admitting: Obstetrics and Gynecology

## 2011-03-05 LAB — CBC
HCT: 20.1 % — ABNORMAL LOW (ref 36.0–46.0)
Hemoglobin: 6.6 g/dL — CL (ref 12.0–15.0)
MCH: 32.2 pg (ref 26.0–34.0)
MCHC: 32.8 g/dL (ref 30.0–36.0)
MCV: 98 fL (ref 78.0–100.0)

## 2011-03-05 LAB — COMPREHENSIVE METABOLIC PANEL
Alkaline Phosphatase: 133 U/L — ABNORMAL HIGH (ref 39–117)
BUN: 10 mg/dL (ref 6–23)
Creatinine, Ser: 0.68 mg/dL (ref 0.50–1.10)
GFR calc Af Amer: >90 mL/min (ref >90)
Glucose, Bld: 98 mg/dL (ref 70–99)
Potassium: 4.3 mEq/L (ref 3.5–5.1)
Total Protein: 5.4 g/dL — ABNORMAL LOW (ref 6.0–8.3)

## 2011-03-05 MED ORDER — CITALOPRAM HYDROBROMIDE 20 MG PO TABS: 20.0000 mg | ORAL_TABLET | Freq: Every day | ORAL | Status: DC

## 2011-03-05 MED ORDER — IBUPROFEN 800 MG PO TABS: 800.0000 mg | ORAL_TABLET | Freq: Three times a day (TID) | ORAL | Status: AC

## 2011-03-05 MED ORDER — SENNOSIDES-DOCUSATE SODIUM 8.6-50 MG PO TABS: 2.0000 | ORAL_TABLET | Freq: Every day | ORAL | Status: AC

## 2011-03-05 MED ORDER — FERROUS SULFATE 325 (65 FE) MG PO TABS: 325.0000 mg | ORAL_TABLET | Freq: Three times a day (TID) | ORAL | Status: DC

## 2011-03-05 NOTE — Discharge Summary (Signed)
Obstetric Discharge Summary Reason for Admission: HELLP Syndrome Prenatal Procedures: none Intrapartum Procedures: spontaneous vaginal delivery Postpartum Procedures: perineal laceration repair Complications-Operative and Postpartum: none Hemoglobin  Date Value Range Status  03/04/2011 6.8* 12.0-15.0 (g/dL) Final     REPEATED TO VERIFY     CRITICAL RESULT CALLED TO, READ BACK BY AND VERIFIED WITH:     K HAMILTON 03/04/11 1222 BY A POTEAT     HCT  Date Value Range Status  03/04/2011 20.0* 36.0-46.0 (%) Final    Discharge Diagnoses: HELLP syndrome, Anemia, depression  Discharge Information: Date: 03/05/2011 Activity: unrestricted Diet: routine Medications: PNV, Ibuprofen, Colace, Iron and Celexa Condition: stable and improved Instructions: refer to practice specific booklet Discharge to: home Follow-up Information    Follow up with HORVATH,MICHELLE A in 5 days. (also, 6 wk postpartum visit)    Contact information:   [address] [place] [address] [phone]          Newborn Data: Live born female  Birth Weight: 6 lb 15.8 oz (3170 g) APGAR: 5, 8  Home with mother.  Philip Aspen 03/05/2011, 11:26 AM

## 2011-03-06 ENCOUNTER — Encounter (HOSPITAL_COMMUNITY): Admission: RE | Admit: 2011-03-06 | Payer: BC Managed Care – PPO | Source: Ambulatory Visit

## 2011-03-14 NOTE — Anesthesia Procedure Notes (Signed)
Procedures

## 2011-04-06 ENCOUNTER — Encounter (HOSPITAL_COMMUNITY)
Admission: RE | Admit: 2011-04-06 | Discharge: 2011-04-06 | Disposition: A | Discharge status: Home / Self Care | Payer: BC Managed Care – PPO | Source: Ambulatory Visit | Attending: Obstetrics and Gynecology | Admitting: Obstetrics and Gynecology

## 2011-05-06 ENCOUNTER — Encounter (HOSPITAL_COMMUNITY)
Admission: RE | Admit: 2011-05-06 | Discharge: 2011-05-06 | Disposition: A | Discharge status: Home / Self Care | Payer: BC Managed Care – PPO | Source: Ambulatory Visit | Attending: Obstetrics and Gynecology | Admitting: Obstetrics and Gynecology

## 2011-06-06 ENCOUNTER — Encounter (HOSPITAL_COMMUNITY)
Admission: RE | Admit: 2011-06-06 | Discharge: 2011-06-06 | Disposition: A | Discharge status: Home / Self Care | Payer: BC Managed Care – PPO | Source: Ambulatory Visit | Attending: Obstetrics and Gynecology | Admitting: Obstetrics and Gynecology

## 2011-07-07 ENCOUNTER — Encounter (HOSPITAL_COMMUNITY)
Admission: RE | Admit: 2011-07-07 | Discharge: 2011-07-07 | Disposition: A | Discharge status: Home / Self Care | Payer: BC Managed Care – PPO | Source: Ambulatory Visit | Attending: Obstetrics and Gynecology | Admitting: Obstetrics and Gynecology

## 2011-08-06 ENCOUNTER — Encounter (HOSPITAL_COMMUNITY)
Admission: RE | Admit: 2011-08-06 | Discharge: 2011-08-06 | Disposition: A | Discharge status: Home / Self Care | Payer: BC Managed Care – PPO | Source: Ambulatory Visit | Attending: Obstetrics and Gynecology | Admitting: Obstetrics and Gynecology

## 2011-09-06 ENCOUNTER — Encounter (HOSPITAL_COMMUNITY)
Admission: RE | Admit: 2011-09-06 | Discharge: 2011-09-06 | Disposition: A | Discharge status: Home / Self Care | Payer: BC Managed Care – PPO | Source: Ambulatory Visit | Attending: Obstetrics and Gynecology | Admitting: Obstetrics and Gynecology

## 2011-10-07 ENCOUNTER — Encounter (HOSPITAL_COMMUNITY)
Admission: RE | Admit: 2011-10-07 | Discharge: 2011-10-07 | Disposition: A | Discharge status: Home / Self Care | Payer: BC Managed Care – PPO | Source: Ambulatory Visit | Attending: Obstetrics and Gynecology | Admitting: Obstetrics and Gynecology

## 2011-11-07 ENCOUNTER — Encounter (HOSPITAL_COMMUNITY)
Admission: RE | Admit: 2011-11-07 | Discharge: 2011-11-07 | Disposition: A | Discharge status: Home / Self Care | Payer: BC Managed Care – PPO | Source: Ambulatory Visit | Attending: Obstetrics and Gynecology | Admitting: Obstetrics and Gynecology

## 2011-11-07 NOTE — Progress Notes (Signed)
Adult Lactation Consultation Outpatient Visit Note  Patient Name: Kathryn [name] Date of Birth: 16-Jun-1980 Gestational Age at Delivery: Unknown Type of Delivery - Vaginal Del, 10/19/2011  19 day old   Breastfeeding History: Frequency of Breastfeeding:  Length of Feeding:  Voids:  Stools:   Supplementing / Method: Pumping:  Type of Pump:   Frequency:  Volume:    Comments:    Consultation Evaluation:  Initial Feeding Assessment: Pre-feed Weight: Post-feed Weight: Amount Transferred: Comments:  Additional Feeding Assessment: Pre-feed Weight: Post-feed Weight: Amount Transferred: Comments:  Additional Feeding Assessment: Pre-feed Weight: Post-feed Weight: Amount Transferred: Comments:  Total Breast milk Transferred this Visit:  Total Supplement Given:   Additional Interventions:   Follow-Up      [name] 11/07/2011, 10:33 AM

## 2011-12-08 ENCOUNTER — Encounter (HOSPITAL_COMMUNITY)
Admission: RE | Admit: 2011-12-08 | Discharge: 2011-12-08 | Disposition: A | Discharge status: Home / Self Care | Payer: BC Managed Care – PPO | Source: Ambulatory Visit | Attending: Obstetrics and Gynecology | Admitting: Obstetrics and Gynecology

## 2012-01-08 ENCOUNTER — Encounter (HOSPITAL_COMMUNITY)
Admission: RE | Admit: 2012-01-08 | Discharge: 2012-01-08 | Disposition: A | Discharge status: Home / Self Care | Payer: BC Managed Care – PPO | Source: Ambulatory Visit | Attending: Obstetrics and Gynecology | Admitting: Obstetrics and Gynecology

## 2012-01-21 ENCOUNTER — Ambulatory Visit: Payer: BC Managed Care – PPO | Admitting: Emergency Medicine

## 2012-01-21 VITALS — BP 100/63 | HR 93 | Temp 98.8°F | Resp 18 | Ht 64.0 in | Wt 146.4 lb

## 2012-01-21 DIAGNOSIS — J020 Streptococcal pharyngitis: Secondary | ICD-10-CM

## 2012-01-21 MED ORDER — AMOXICILLIN 875 MG PO TABS: 875.0000 mg | ORAL_TABLET | Freq: Two times a day (BID) | ORAL | Status: AC

## 2012-01-21 NOTE — Progress Notes (Addendum)
   Date:  01/21/2012   Name:  Kathryn [name]   DOB:  09/21/1980   MRN:  [id number] Gender: female Age: 31 y.o.  PCP:  Vernell Morgans, MD    Chief Complaint: Influenza   History of Present Illness:  Kathryn [name] is a 31 y.o. pleasant patient who presents with the following:  Ill with a sore throat for past three days.  Has associated clear watery nasal drainage.  Clearing throat frequently.  Nauseated but no vomiting.  No stool change.  Has low back pain but no dysuria, frequency, urgency, dyspareunia, or discharge.  Breast feeding but no breast pain or tenderness.  Infant is 31 months old.  Has fever past 24 hours to 102 with associated chills.  Resolves with ibuprofen.  No rash or headache, no sinus pain or tenderness.  No sick contacts.  Patient Active Problem List  Diagnosis  (none) - all problems resolved or deleted    Past Medical History  Diagnosis Date  . Anxiety   . Obsessive compulsive disorder   . Depression     Past Surgical History  Procedure Date  . Wisdom tooth extraction     History  Substance Use Topics  . Smoking status: Never Smoker   . Smokeless tobacco: Never Used  . Alcohol Use: Yes     social    No family history on file.  Allergies  Allergen Reactions  . Naproxen Other (See Comments)    Stomach pain  . Percocet (Oxycodone-Acetaminophen) Itching  . Sulfa Antibiotics Other (See Comments)    Childhood reaction    Medication list has been reviewed and updated.  Current Outpatient Prescriptions on File Prior to Visit  Medication Sig Dispense Refill  . citalopram (CELEXA) 20 MG tablet Take 1 tablet (20 mg total) by mouth daily.  30 tablet  1  . Prenatal Vit-Fe Fum-FA-Omega (ONE-A-DAY WOMENS PRENATAL PO) Take 1 tablet by mouth daily.        . ferrous sulfate 325 (65 FE) MG tablet Take 1 tablet (325 mg total) by mouth 3 (three) times daily with meals.  90 tablet  1  . senna-docusate (SENOKOT-S) 8.6-50 MG per tablet Take 2 tablets by mouth  at bedtime.  30 tablet  1    Review of Systems:  As per HPI, otherwise negative.    Physical Examination: Filed Vitals:   01/21/12 1929  BP: 100/63  Pulse: 93  Temp: 98.8 F (37.1 C)  Resp: 18   Filed Vitals:   01/21/12 1929  Height: 5\' 4"  (1.626 m)  Weight: 146 lb 6.4 oz (66.407 kg)   Body mass index is 25.13 kg/(m^2). Ideal Body Weight: Weight in (lb) to have BMI = 25: 145.3   GEN: WDWN, NAD, Non-toxic, A & O x 3.  No rash or sepsis HEENT: Atraumatic, Normocephalic. Neck supple. No masses, anterior cervical lymphadenopathy tender.  Oropharynx red and injected. No exudate Ears and Nose: No external deformity.  TM negative.  Nasal mucosa negative. CV: RRR, No M/G/R. No JVD. No thrill. No extra heart sounds. PULM: CTA B, no wheezes, crackles, rhonchi. No retractions. No resp. distress. No accessory muscle use. ABD: S, NT, ND, +BS. No rebound. No HSM. EXTR: No c/c/e NEURO Normal gait.  PSYCH: Normally interactive. Conversant. Not depressed or anxious appearing.  Calm demeanor.    Assessment and Plan: Strep pharyngitis Amoxicillin Follow up as needed  Carmelina Dane, MD I have reviewed and agree with documentation. Robert P. Merla Riches, M.D.

## 2012-02-08 ENCOUNTER — Encounter (HOSPITAL_COMMUNITY)
Admission: RE | Admit: 2012-02-08 | Discharge: 2012-02-08 | Disposition: A | Discharge status: Home / Self Care | Payer: BC Managed Care – PPO | Source: Ambulatory Visit | Attending: Obstetrics and Gynecology | Admitting: Obstetrics and Gynecology

## 2012-05-14 NOTE — L&D Delivery Note (Signed)
Patient was C/C/+3 and pushed for 10 minutes with epidural.   NSVD  female infant after 30 second shoulder dystocia relieved with suprapubic pressure and McRoberts. Apgars 7,9, weight P.   The patient had one midline episiotomy repaired with 2-0 vicryl R. Fundus was firm. EBL was expected. Placenta was delivered intact. Vagina was clear.  Baby was vigorous and doing skin to skin with mother.  Julie-Anne Torain A

## 2012-07-23 ENCOUNTER — Encounter: Payer: Self-pay | Admitting: *Deleted

## 2012-07-23 ENCOUNTER — Encounter: Payer: BC Managed Care – PPO | Attending: Obstetrics and Gynecology | Admitting: *Deleted

## 2012-07-23 ENCOUNTER — Encounter: Payer: BC Managed Care – PPO | Admitting: Dietician

## 2012-07-23 VITALS — Ht 63.5 in | Wt 139.3 lb

## 2012-07-23 DIAGNOSIS — R7309 Other abnormal glucose: Secondary | ICD-10-CM | POA: Insufficient documentation

## 2012-07-23 DIAGNOSIS — Z713 Dietary counseling and surveillance: Secondary | ICD-10-CM | POA: Insufficient documentation

## 2012-07-23 NOTE — Progress Notes (Signed)
Medical Nutrition Therapy:  Appt start time: 1530 end time:  1630.  Assessment:  Patient here today due to elevated A1c of 6.3. She is currently in [employer] to lose weight. However, she just found out she is pregnant (1st trimester). She reports that she tries to eat healthy, but sometimes gets off track. She is also good at tracking intake using [employer] app. She works long hours (50-60 hours a week), and is often stressed, doesn't have time for exercise. Blood glucose monitoring covered with patient today with [name]. Blood glucose at 89. She is advised to check fasting and 2 hours after each meal.   WEIGHT: 139.3 pounds BMI: 63.5  MEDICATIONS: Prenatal vitamin   DIETARY INTAKE:   Usual eating pattern includes 3 meals and 2 snacks per day.  24-hr recall:  B ( AM): cottage cheese, 2 slices wheat toast with butter or PB, coffee with sweetened creamer 1 tbsp  Snk ( AM): Sometimes, fruit (clementine, apple banana, pear) L ( PM): Eats out, Chipotle (burrito bowl), Jimmy John's (un-wich), Panera (salad), [place] yogurt Snk ( PM): None D ( PM): Lean protein ([place] burger, chicken), vegetables x 2 Snk ( PM): 3 mini Cadbury eggs, sweets, 1 ounce dark chocolate Beverages: coffee, water, fizzy water  Usual physical activity: None  Estimated energy needs: 1900 calories 214 g carbohydrates 143 g protein 53 g fat  Progress Towards Goal(s):  In progress.   Nutritional Diagnosis:  NB-1.1 Food and nutrition-related knowledge deficit As related to elevated A1c.  As evidenced by no prior education.    Intervention:  Nutrition counseling. We discussed basic carb counting, including foods with carbs, label reading, portion size, and meal planning. We also discussed healthy nutrition during pregnancy including healthy weight gain.   Goals:  1. 3-4 carb servings at meals, 1-2 servings at snacks. 2. Monitor portion size of carbohydrate foods.  3. Continue to track diet online for  carbohydrate intake.  4. Work up to walking at least 30 minutes, 3 days weekly.   Handouts given during visit include:  Carb counting booklet  Yellow portion card  Monitoring/Evaluation:  Dietary intake, exercise, blood glucose, and body weight in 1 month(s).

## 2012-08-19 LAB — OB RESULTS CONSOLE ABO/RH

## 2012-08-19 LAB — OB RESULTS CONSOLE ANTIBODY SCREEN: Antibody Screen: NEGATIVE

## 2012-09-03 ENCOUNTER — Ambulatory Visit: Payer: BC Managed Care – PPO | Admitting: *Deleted

## 2013-02-04 ENCOUNTER — Encounter (HOSPITAL_COMMUNITY): Payer: Self-pay

## 2013-02-04 ENCOUNTER — Inpatient Hospital Stay (HOSPITAL_COMMUNITY)
Admission: AD | Admit: 2013-02-04 | Discharge: 2013-02-04 | Disposition: A | Discharge status: Home / Self Care | Payer: BC Managed Care – PPO | Source: Ambulatory Visit | Attending: Obstetrics and Gynecology | Admitting: Obstetrics and Gynecology

## 2013-02-04 ENCOUNTER — Inpatient Hospital Stay (HOSPITAL_COMMUNITY): Payer: BC Managed Care – PPO

## 2013-02-04 DIAGNOSIS — O479 False labor, unspecified: Secondary | ICD-10-CM

## 2013-02-04 DIAGNOSIS — O4700 False labor before 37 completed weeks of gestation, unspecified trimester: Secondary | ICD-10-CM | POA: Insufficient documentation

## 2013-02-04 DIAGNOSIS — O4690 Antepartum hemorrhage, unspecified, unspecified trimester: Secondary | ICD-10-CM | POA: Insufficient documentation

## 2013-02-04 DIAGNOSIS — O4703 False labor before 37 completed weeks of gestation, third trimester: Secondary | ICD-10-CM

## 2013-02-04 DIAGNOSIS — O4693 Antepartum hemorrhage, unspecified, third trimester: Secondary | ICD-10-CM

## 2013-02-04 NOTE — MAU Note (Signed)
Patient is in with c/o lower back pain and vaginal bleeding (patient states that its spotting when she wipes). Denies recent intercourse. Reports good fetal movement.

## 2013-02-04 NOTE — MAU Note (Signed)
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  Overdid it this weekend, not a lot of sleep.  Mon, had some blurring and back ache.  Spotted today,just doesn't feel good. Was 1cm dilated, sent ver for monitoring and [place].

## 2013-02-04 NOTE — MAU Provider Note (Signed)
Chief Complaint:  Back Pain and Vaginal Bleeding   First Provider Initiated Contact with Patient 02/04/13 2012     HPI: Kathryn [name] is a 32 y.o. G2P0101 at [redacted]w[redacted]d who sent to maternity admissions for health ultrasound and NST for evaluation of light vaginal bleeding last night and today, increased pelvic pressure and cervix dilated 1 cm at office this afternoon. Patient very active over the weekend and feels as if she "over-did it". Feels very tired. Complains of low back pain, sometimes intermittent, sometimes constant and sore since this weekend. Unsure if she is contracting. No further vaginal bleeding this afternoon. Denies fever, chills, vaginal discharge or leaking fluid. States she had a speculum exam in the office. Good fetal movement.   Pregnancy Course: Uncomplicated.  Past Medical History: Past Medical History  Diagnosis Date  . Anxiety   . Obsessive compulsive disorder   . Depression     Past obstetric history: OB History  Gravida Para Term Preterm AB SAB TAB Ectopic Multiple Living  2 1  1      1     # Outcome Date GA Lbr Len/2nd Weight Sex Delivery Anes PTL Lv  2 CUR           1 PRE 03/01/11 [redacted]w[redacted]d 01:30 / 01:01 3.17 kg (6 lb 15.8 oz) M SVD None  Y      Past Surgical History: Past Surgical History  Procedure Laterality Date  . Wisdom tooth extraction       Family History: History reviewed. No pertinent family history.  Social History: History  Substance Use Topics  . Smoking status: Never Smoker   . Smokeless tobacco: Never Used  . Alcohol Use: Yes     Comment: social    Allergies:  Allergies  Allergen Reactions  . Naproxen Other (See Comments)    Stomach pain  . Percocet [Oxycodone-Acetaminophen] Itching  . Sulfa Antibiotics Other (See Comments)    Childhood reaction    Meds:  Prescriptions prior to admission  Medication Sig Dispense Refill  . citalopram (CELEXA) 20 MG tablet Take 20 mg by mouth at bedtime.      . Docosahexaenoic Acid (DHA PO)  Take 1 Can by mouth at bedtime.      . famotidine (PEPCID) 20 MG tablet Take 20 mg by mouth 2 (two) times daily as needed for heartburn.      . glyBURIDE (DIABETA) 1.25 MG tablet Take 0.5 tablets by mouth 2 (two) times daily.      . Prenatal Vit-Fe Fumarate-FA (PRENATAL MULTIVITAMIN) TABS tablet Take 1 tablet by mouth at bedtime.        ROS: Pertinent findings in history of present illness.  Physical Exam  Blood pressure 115/73, pulse 100, temperature 99.2 F (37.3 C), temperature source Oral, resp. rate 18. GENERAL: Well-developed, well-nourished female in no acute distress.  HEENT: normocephalic HEART: normal rate RESP: normal effort ABDOMEN: Soft, non-tender, gravid appropriate for gestational age EXTREMITIES: Nontender, no edema NEURO: alert and oriented SPECULUM EXAM: Deferred. Dilation: 1 Effacement (%): 50 Station: -2 Exam by:: Ivonne Andrew CNM  FHT:  Baseline 120 , moderate variability, accelerations present, no decelerations Contractions: Rare, mild with uterine irritability.   Labs: No results found for this or any previous visit (from the past 24 hour(s)).  Imaging:     MAU Course: Reviewed ultrasound, repeat cervical exam and NST with Dr. Dareen Piano. May discharge patient home. Patient has trip to [place] planned for this weekend. Wants to know she can still go. Also  wants to know if she needs to have NST tomorrow as scheduled. Doctors Anderson recommends cervical exam before leaving. Travel precautions. No NST needed tomorrow.  Assessment: 1. Preterm contractions, third trimester   2. Vaginal bleeding in pregnancy, third trimester    Plan: Discharge home in stable condition. Bleeding precautions. Preterm labor precautions and fetal kick counts. Pelvic rest x1 week. Increase fluids and rest. Note given to outpatient to work from home for the next 2 days.     Follow-up Information   Follow up with [facility] On  02/06/2013. (or as needed  if symptoms worsen)    Contact information:   [address] [place] [address] [phone]      Follow up with [facility] ADMISS[facility]. (As needed if symptoms worsen)    Contact information:   [id number] [place] [address] [phone]       Medication List         citalopram 20 MG tablet  Commonly known as:  CELEXA  Take 20 mg by mouth at bedtime.     DHA PO  Take 1 Can by mouth at bedtime.     famotidine 20 MG tablet  Commonly known as:  PEPCID  Take 20 mg by mouth 2 (two) times daily as needed for heartburn.     glyBURIDE 1.25 MG tablet  Commonly known as:  DIABETA  Take 0.5 tablets by mouth 2 (two) times daily.     prenatal multivitamin Tabs tablet  Take 1 tablet by mouth at bedtime.       [place], [place] 02/04/2013 8:36 PM

## 2013-02-06 ENCOUNTER — Inpatient Hospital Stay (HOSPITAL_COMMUNITY)
Admission: AD | Admit: 2013-02-06 | Discharge: 2013-02-06 | Disposition: A | Discharge status: Home / Self Care | Payer: BC Managed Care – PPO | Source: Ambulatory Visit | Attending: Obstetrics and Gynecology | Admitting: Obstetrics and Gynecology

## 2013-02-06 DIAGNOSIS — O4700 False labor before 37 completed weeks of gestation, unspecified trimester: Secondary | ICD-10-CM | POA: Insufficient documentation

## 2013-02-06 MED ORDER — BETAMETHASONE SOD PHOS & ACET 6 (3-3) MG/ML IJ SUSP
12.0000 mg | Freq: Once | INTRAMUSCULAR | Status: AC
  Administered 2013-02-06: 12 mg via INTRAMUSCULAR
  Filled 2013-02-06: qty 2

## 2013-02-07 ENCOUNTER — Inpatient Hospital Stay (HOSPITAL_COMMUNITY)
Admission: AD | Admit: 2013-02-07 | Discharge: 2013-02-07 | Disposition: A | Discharge status: Home / Self Care | Payer: BC Managed Care – PPO | Source: Ambulatory Visit | Attending: Obstetrics & Gynecology | Admitting: Obstetrics & Gynecology

## 2013-02-07 DIAGNOSIS — O4700 False labor before 37 completed weeks of gestation, unspecified trimester: Secondary | ICD-10-CM | POA: Insufficient documentation

## 2013-02-07 MED ORDER — BETAMETHASONE SOD PHOS & ACET 6 (3-3) MG/ML IJ SUSP
12.0000 mg | Freq: Once | INTRAMUSCULAR | Status: AC
  Administered 2013-02-07: 12 mg via INTRAMUSCULAR
  Filled 2013-02-07: qty 2

## 2013-02-25 ENCOUNTER — Telehealth (HOSPITAL_COMMUNITY): Payer: Self-pay | Admitting: *Deleted

## 2013-02-25 NOTE — Telephone Encounter (Signed)
Preadmission screen  

## 2013-02-27 ENCOUNTER — Encounter (HOSPITAL_COMMUNITY): Payer: Self-pay

## 2013-02-27 ENCOUNTER — Observation Stay (HOSPITAL_COMMUNITY)
Admission: RE | Admit: 2013-02-27 | Discharge: 2013-02-27 | Disposition: A | Discharge status: Home / Self Care | Payer: BC Managed Care – PPO | Source: Ambulatory Visit | Attending: Obstetrics & Gynecology | Admitting: Obstetrics & Gynecology

## 2013-02-27 DIAGNOSIS — O321XX0 Maternal care for breech presentation, not applicable or unspecified: Principal | ICD-10-CM | POA: Insufficient documentation

## 2013-02-27 MED ORDER — TERBUTALINE SULFATE 1 MG/ML IJ SOLN
INTRAMUSCULAR | Status: AC
  Administered 2013-02-27: 0.25 mg via SUBCUTANEOUS
  Filled 2013-02-27: qty 1

## 2013-02-27 MED ORDER — TERBUTALINE SULFATE 1 MG/ML IJ SOLN
0.2500 mg | Freq: Once | INTRAMUSCULAR | Status: AC
  Administered 2013-02-27: 0.25 mg via SUBCUTANEOUS

## 2013-02-27 NOTE — Discharge Summary (Signed)
Successful version.  Post version NST reactive.  Follow up in office 1 week.

## 2013-02-27 NOTE — H&P (Signed)
Observation note:  G2P1 at 36+weeks brought to MAU for version.  No pregnancy complications.  Preversion scan documents persistent breech.

## 2013-03-07 ENCOUNTER — Inpatient Hospital Stay (HOSPITAL_COMMUNITY)
Admission: AD | Admit: 2013-03-07 | Discharge: 2013-03-07 | Disposition: A | Discharge status: Home / Self Care | Payer: BC Managed Care – PPO | Source: Ambulatory Visit | Attending: Obstetrics & Gynecology | Admitting: Obstetrics & Gynecology

## 2013-03-07 DIAGNOSIS — O99810 Abnormal glucose complicating pregnancy: Secondary | ICD-10-CM | POA: Insufficient documentation

## 2013-03-07 NOTE — MAU Note (Signed)
Pt presents for non stress test. Pt states that she is having NST for gestational diabetes

## 2013-03-16 ENCOUNTER — Encounter (HOSPITAL_COMMUNITY): Payer: Self-pay | Admitting: *Deleted

## 2013-03-19 ENCOUNTER — Other Ambulatory Visit: Payer: Self-pay

## 2013-03-24 ENCOUNTER — Inpatient Hospital Stay (HOSPITAL_COMMUNITY): Admission: RE | Admit: 2013-03-24 | Payer: BC Managed Care – PPO | Source: Ambulatory Visit

## 2013-03-27 ENCOUNTER — Encounter (HOSPITAL_COMMUNITY): Payer: Self-pay | Admitting: Anesthesiology

## 2013-03-27 ENCOUNTER — Encounter (HOSPITAL_COMMUNITY): Payer: Self-pay

## 2013-03-27 ENCOUNTER — Inpatient Hospital Stay (HOSPITAL_COMMUNITY)
Admission: RE | Admit: 2013-03-27 | Discharge: 2013-03-28 | DRG: 775 | Disposition: A | Discharge status: Home / Self Care | Payer: BC Managed Care – PPO | Source: Ambulatory Visit | Attending: Obstetrics and Gynecology | Admitting: Obstetrics and Gynecology

## 2013-03-27 DIAGNOSIS — O99814 Abnormal glucose complicating childbirth: Principal | ICD-10-CM | POA: Diagnosis present

## 2013-03-27 DIAGNOSIS — O480 Post-term pregnancy: Secondary | ICD-10-CM | POA: Diagnosis present

## 2013-03-27 DIAGNOSIS — Z794 Long term (current) use of insulin: Secondary | ICD-10-CM

## 2013-03-27 LAB — CBC
HCT: 37.7 % (ref 36.0–46.0)
HCT: 35.8 % — ABNORMAL LOW (ref 36.0–46.0)
Hemoglobin: 12.9 g/dL (ref 12.0–15.0)
Hemoglobin: 12.4 g/dL (ref 12.0–15.0)
MCH: 31.4 pg (ref 26.0–34.0)
MCHC: 34.2 g/dL (ref 30.0–36.0)
MCV: 91.7 fL (ref 78.0–100.0)
MCV: 91.1 fL (ref 78.0–100.0)
Platelets: 98 10*3/uL — ABNORMAL LOW (ref 150–400)
RBC: 3.93 MIL/uL (ref 3.87–5.11)
RDW: 13.7 % (ref 11.5–15.5)
RDW: 13.6 % (ref 11.5–15.5)
WBC: 8.9 10*3/uL (ref 4.0–10.5)
WBC: 9.1 10*3/uL (ref 4.0–10.5)

## 2013-03-27 LAB — COMPREHENSIVE METABOLIC PANEL
ALT: 19 U/L (ref 0–35)
Albumin: 2.6 g/dL — ABNORMAL LOW (ref 3.5–5.2)
Alkaline Phosphatase: 136 U/L — ABNORMAL HIGH (ref 39–117)
BUN: 9 mg/dL (ref 6–23)
Chloride: 101 mEq/L (ref 96–112)
Creatinine, Ser: 0.62 mg/dL (ref 0.50–1.10)
GFR calc non Af Amer: >90 mL/min (ref >90)
Glucose, Bld: 95 mg/dL (ref 70–99)
Potassium: 3.6 mEq/L (ref 3.5–5.1)
Total Bilirubin: 0.3 mg/dL (ref 0.3–1.2)
Total Protein: 5.7 g/dL — ABNORMAL LOW (ref 6.0–8.3)

## 2013-03-27 LAB — URIC ACID: Uric Acid, Serum: 3.9 mg/dL (ref 2.4–7.0)

## 2013-03-27 MED ORDER — TERBUTALINE SULFATE 1 MG/ML IJ SOLN: 0.2500 mg | Freq: Once | INTRAMUSCULAR | Status: DC | PRN

## 2013-03-27 MED ORDER — ACETAMINOPHEN 325 MG PO TABS: 650.0000 mg | ORAL_TABLET | ORAL | Status: DC | PRN

## 2013-03-27 MED ORDER — DIBUCAINE 1 % RE OINT: 1.0000 "application " | TOPICAL_OINTMENT | RECTAL | Status: DC | PRN

## 2013-03-27 MED ORDER — OXYTOCIN 40 UNITS IN LACTATED RINGERS INFUSION - SIMPLE MED: 1.0000 m[IU]/min | INTRAVENOUS | Status: DC

## 2013-03-27 MED ORDER — METHYLERGONOVINE MALEATE 0.2 MG PO TABS: 0.2000 mg | ORAL_TABLET | ORAL | Status: DC | PRN

## 2013-03-27 MED ORDER — MEASLES, MUMPS & RUBELLA VAC ~~LOC~~ INJ
0.5000 mL | INJECTION | Freq: Once | SUBCUTANEOUS | Status: DC
  Filled 2013-03-27: qty 0.5

## 2013-03-27 MED ORDER — PRENATAL MULTIVITAMIN CH
1.0000 | ORAL_TABLET | Freq: Every day | ORAL | Status: DC
  Administered 2013-03-28: 1 via ORAL
  Filled 2013-03-27: qty 1

## 2013-03-27 MED ORDER — EPHEDRINE 5 MG/ML INJ
10.0000 mg | INTRAVENOUS | Status: DC | PRN
  Filled 2013-03-27: qty 2

## 2013-03-27 MED ORDER — IBUPROFEN 800 MG PO TABS: 800.0000 mg | ORAL_TABLET | Freq: Three times a day (TID) | ORAL | Status: DC

## 2013-03-27 MED ORDER — METHYLERGONOVINE MALEATE 0.2 MG/ML IJ SOLN: 0.2000 mg | INTRAMUSCULAR | Status: DC | PRN

## 2013-03-27 MED ORDER — ONDANSETRON HCL 4 MG PO TABS: 4.0000 mg | ORAL_TABLET | ORAL | Status: DC | PRN

## 2013-03-27 MED ORDER — ONDANSETRON HCL 4 MG/2ML IJ SOLN: 4.0000 mg | Freq: Four times a day (QID) | INTRAMUSCULAR | Status: DC | PRN

## 2013-03-27 MED ORDER — SIMETHICONE 80 MG PO CHEW
80.0000 mg | CHEWABLE_TABLET | ORAL | Status: DC | PRN
Start: 2013-03-27 — End: 2013-03-28

## 2013-03-27 MED ORDER — BUTORPHANOL TARTRATE 1 MG/ML IJ SOLN
1.0000 mg | INTRAMUSCULAR | Status: DC | PRN
  Administered 2013-03-27: 1 mg via INTRAVENOUS
  Filled 2013-03-27: qty 1

## 2013-03-27 MED ORDER — OXYTOCIN BOLUS FROM INFUSION: 500.0000 mL | INTRAVENOUS | Status: DC

## 2013-03-27 MED ORDER — LACTATED RINGERS IV SOLN: INTRAVENOUS | Status: DC

## 2013-03-27 MED ORDER — PHENYLEPHRINE 40 MCG/ML (10ML) SYRINGE FOR IV PUSH (FOR BLOOD PRESSURE SUPPORT)
80.0000 ug | PREFILLED_SYRINGE | INTRAVENOUS | Status: DC | PRN
  Filled 2013-03-27: qty 2

## 2013-03-27 MED ORDER — INFLUENZA VAC SPLIT QUAD 0.5 ML IM SUSP
0.5000 mL | INTRAMUSCULAR | Status: AC
  Administered 2013-03-28: 0.5 mL via INTRAMUSCULAR
  Filled 2013-03-27: qty 0.5

## 2013-03-27 MED ORDER — OXYCODONE-ACETAMINOPHEN 5-325 MG PO TABS: 1.0000 | ORAL_TABLET | ORAL | Status: DC | PRN

## 2013-03-27 MED ORDER — DIPHENHYDRAMINE HCL 50 MG/ML IJ SOLN: 12.5000 mg | INTRAMUSCULAR | Status: DC | PRN

## 2013-03-27 MED ORDER — CITALOPRAM HYDROBROMIDE 20 MG PO TABS
20.0000 mg | ORAL_TABLET | Freq: Every day | ORAL | Status: DC
  Administered 2013-03-27: 20 mg via ORAL
  Filled 2013-03-27 (×3): qty 1

## 2013-03-27 MED ORDER — SODIUM CHLORIDE 0.9 % IV SOLN: 250.0000 mL | INTRAVENOUS | Status: DC | PRN

## 2013-03-27 MED ORDER — FERROUS SULFATE 325 (65 FE) MG PO TABS
325.0000 mg | ORAL_TABLET | Freq: Two times a day (BID) | ORAL | Status: DC
  Administered 2013-03-27 – 2013-03-28 (×2): 325 mg via ORAL
  Filled 2013-03-27 (×2): qty 1

## 2013-03-27 MED ORDER — OXYTOCIN 40 UNITS IN LACTATED RINGERS INFUSION - SIMPLE MED
62.5000 mL/h | INTRAVENOUS | Status: DC
  Administered 2013-03-27: 62.5 mL/h via INTRAVENOUS
  Filled 2013-03-27: qty 1000

## 2013-03-27 MED ORDER — MAGNESIUM HYDROXIDE 400 MG/5ML PO SUSP: 30.0000 mL | ORAL | Status: DC | PRN

## 2013-03-27 MED ORDER — SODIUM CHLORIDE 0.9 % IJ SOLN: 3.0000 mL | Freq: Two times a day (BID) | INTRAMUSCULAR | Status: DC

## 2013-03-27 MED ORDER — ZOLPIDEM TARTRATE 5 MG PO TABS: 5.0000 mg | ORAL_TABLET | Freq: Every evening | ORAL | Status: DC | PRN

## 2013-03-27 MED ORDER — SODIUM CHLORIDE 0.9 % IJ SOLN: 3.0000 mL | INTRAMUSCULAR | Status: DC | PRN

## 2013-03-27 MED ORDER — DIPHENHYDRAMINE HCL 25 MG PO CAPS: 25.0000 mg | ORAL_CAPSULE | Freq: Four times a day (QID) | ORAL | Status: DC | PRN

## 2013-03-27 MED ORDER — IBUPROFEN 600 MG PO TABS: 600.0000 mg | ORAL_TABLET | Freq: Four times a day (QID) | ORAL | Status: DC | PRN

## 2013-03-27 MED ORDER — LACTATED RINGERS IV SOLN: 500.0000 mL | INTRAVENOUS | Status: DC | PRN

## 2013-03-27 MED ORDER — BENZOCAINE-MENTHOL 20-0.5 % EX AERO
1.0000 "application " | INHALATION_SPRAY | CUTANEOUS | Status: DC | PRN
  Administered 2013-03-27: 1 via TOPICAL
  Filled 2013-03-27: qty 56

## 2013-03-27 MED ORDER — FENTANYL 2.5 MCG/ML BUPIVACAINE 1/10 % EPIDURAL INFUSION (WH - ANES): 14.0000 mL/h | INTRAMUSCULAR | Status: DC | PRN

## 2013-03-27 MED ORDER — WITCH HAZEL-GLYCERIN EX PADS: 1.0000 "application " | MEDICATED_PAD | CUTANEOUS | Status: DC | PRN

## 2013-03-27 MED ORDER — LACTATED RINGERS IV SOLN: 500.0000 mL | Freq: Once | INTRAVENOUS | Status: DC

## 2013-03-27 MED ORDER — ONDANSETRON HCL 4 MG/2ML IJ SOLN: 4.0000 mg | INTRAMUSCULAR | Status: DC | PRN

## 2013-03-27 MED ORDER — FAMOTIDINE 20 MG PO TABS
20.0000 mg | ORAL_TABLET | Freq: Two times a day (BID) | ORAL | Status: DC | PRN
  Administered 2013-03-27: 20 mg via ORAL
  Filled 2013-03-27: qty 1

## 2013-03-27 MED ORDER — LANOLIN HYDROUS EX OINT: TOPICAL_OINTMENT | CUTANEOUS | Status: DC | PRN

## 2013-03-27 MED ORDER — SENNOSIDES-DOCUSATE SODIUM 8.6-50 MG PO TABS
2.0000 | ORAL_TABLET | ORAL | Status: DC
  Administered 2013-03-28: 2 via ORAL
  Filled 2013-03-27: qty 2

## 2013-03-27 MED ORDER — TETANUS-DIPHTH-ACELL PERTUSSIS 5-2.5-18.5 LF-MCG/0.5 IM SUSP: 0.5000 mL | Freq: Once | INTRAMUSCULAR | Status: DC

## 2013-03-27 MED ORDER — LIDOCAINE HCL (PF) 1 % IJ SOLN
30.0000 mL | INTRAMUSCULAR | Status: DC | PRN
  Administered 2013-03-27: 30 mL via SUBCUTANEOUS
  Filled 2013-03-27 (×2): qty 30

## 2013-03-27 MED ORDER — CITRIC ACID-SODIUM CITRATE 334-500 MG/5ML PO SOLN: 30.0000 mL | ORAL | Status: DC | PRN

## 2013-03-27 MED ORDER — ACETAMINOPHEN 500 MG PO TABS
1000.0000 mg | ORAL_TABLET | Freq: Four times a day (QID) | ORAL | Status: DC | PRN
  Administered 2013-03-27 – 2013-03-28 (×2): 1000 mg via ORAL
  Filled 2013-03-27 (×3): qty 2

## 2013-03-27 NOTE — Anesthesia Preprocedure Evaluation (Addendum)
Anesthesia Evaluation  Patient identified by MRN, date of birth, ID band Patient awake    Reviewed: Allergy & Precautions, H&P   Airway Mallampati: III TM Distance: >3 FB Neck ROM: Full    Dental no notable dental hx. (+) Teeth Intact   Pulmonary neg pulmonary ROS,  breath sounds clear to auscultation  Pulmonary exam normal       Cardiovascular Rhythm:Regular Rate:Normal     Neuro/Psych PSYCHIATRIC DISORDERS Anxiety Depression OCD negative neurological ROS     GI/Hepatic Neg liver ROS, GERD-  Medicated and Controlled,  Endo/Other  diabetes, Well Controlled, Gestational, Oral Hypoglycemic Agents  Renal/GU negative Renal ROS  negative genitourinary   Musculoskeletal negative musculoskeletal ROS (+)   Abdominal   Peds  Hematology negative hematology ROS (+)   Anesthesia Other Findings   Reproductive/Obstetrics (+) Pregnancy Hx/o HELLP syndrome last pregnancy                            Anesthesia Physical Anesthesia Plan  ASA: II  Anesthesia Plan: Epidural   Post-op Pain Management:    Induction:   Airway Management Planned: Natural Airway  Additional Equipment:   Intra-op Plan:   Post-operative Plan:   Informed Consent: I have reviewed the patients History and Physical, chart, labs and discussed the procedure including the risks, benefits and alternatives for the proposed anesthesia with the patient or authorized representative who has indicated his/her understanding and acceptance.     Plan Discussed with: Anesthesiologist  Anesthesia Plan Comments: (Patient delivered while awaiting repeat platelet count. Epidural catheter never placed.)       Anesthesia Quick Evaluation

## 2013-03-27 NOTE — Lactation Note (Signed)
This note was copied from the chart of Kathryn Rosalva Neary. Lactation Consultation Note   Initial consult with this mom and baby, full term and 6 hours old, cluster feeding since birth. Mom was using side lying position when I walked in the room, and had her hand behind the baby's head. Mom is an experienced breast feeder, and was feeding this baby as she would an older child. She was not holding her breast and bringing the baby to the breast. The baby was latching onto her nipple, and causing blister, and bleeding. I had mom change to football hold, and showed her how to obtain a deep latch but bringing the baby to her, and positioning her hand behind the baby's back instead of her head. Mom reports this latch much more comfortable. I also told mom the baby will now transfer much more colostrum, since she needs to be deeper than mom's nipple to do so. I reviewed teaching from the baby and me book with mom. Mom very receptive, and seemed to now recall breast feeding a newborns. I showed mom how to hand express and apply EBM to her rfaw nipples, and gave her comfort gels to use. Mom knows to call for questions/concerns.  Patient Name: Kathryn [name] [id number] Date: 03/27/2013 Reason for consult: Initial assessment   Maternal Data Formula Feeding for Exclusion: No Infant to breast within first hour of birth: Yes Has patient been taught Hand Expression?: Yes Does the patient have breastfeeding experience prior to this delivery?: Yes  Feeding Feeding Type: Breast Fed Length of feed: 30 min (baby has been continuously feeding )  LATCH Score/Interventions Latch: Grasps breast easily, tongue down, lips flanged, rhythmical sucking. (mom was not bringing baby to breast)  Audible Swallowing: A few with stimulation Intervention(s): Skin to skin;Hand expression  Type of Nipple: Everted at rest and after stimulation  Comfort (Breast/Nipple): Engorged, cracked, bleeding, large blisters, severe  discomfort Problem noted: Cracked, bleeding, blisters, bruises (baby was nipple sucking, cracked, bleeding, blisters) Intervention(s): Expressed breast milk to nipple  Problem noted: Severe discomfort  Hold (Positioning): Assistance needed to correctly position infant at breast and maintain latch. Intervention(s): Breastfeeding basics reviewed;Support Pillows;Position options;Skin to skin  LATCH Score: 6  Lactation Tools Discussed/Used Tools: Comfort gels   Consult Status Consult Status: Follow-up Date: 03/28/13 Follow-up type: In-patient    Alfred Levins 03/27/2013, 6:37 PM

## 2013-03-27 NOTE — H&P (Signed)
32 y.o. [redacted]w[redacted]d  G2P0101 comes in for post dates induction with A2(possible pregestational) DM.  Otherwise has good fetal movement and no bleeding.  Sugars have been under excellent control with glyburide.  Past Medical History  Diagnosis Date  . Anxiety   . Obsessive compulsive disorder   . Depression   . Hx of HELLP syndrome, currently pregnant   . Gestational diabetes     glyburide    Past Surgical History  Procedure Laterality Date  . Wisdom tooth extraction    . No past surgeries      OB History  Gravida Para Term Preterm AB SAB TAB Ectopic Multiple Living  2 1  1      1     # Outcome Date GA Lbr Len/2nd Weight Sex Delivery Anes PTL Lv  2 CUR           1 PRE 03/01/11 109w5d 01:30 / 01:01 3.17 kg (6 lb 15.8 oz) M SVD None  Y      History   Social History  . Marital Status: Married    Spouse Name: N/A    Number of Children: N/A  . Years of Education: N/A   Occupational History  . Not on file.   Social History Main Topics  . Smoking status: Never Smoker   . Smokeless tobacco: Never Used  . Alcohol Use: Yes     Comment: social  . Drug Use: No  . Sexual Activity: Yes    Birth Control/ Protection: None   Other Topics Concern  . Not on file   Social History Narrative  . No narrative on file   Naproxen; Percocet; and Sulfa antibiotics    Prenatal Transfer Tool  Maternal Diabetes: Yes:  Diabetes Type:  Insulin/Medication controlled Genetic Screening: Declined Maternal Ultrasounds/Referrals: Normal Fetal Ultrasounds or other Referrals:  None Maternal Substance Abuse:  No Significant Maternal Medications:  Meds include: Other: Glyburide Significant Maternal Lab Results: None  Other PNC: uncomplicated.  Pt underwent ECV for breech and is now VTX.   Vitals P  Lungs/Cor:  NAD Abdomen:  soft, gravid Ex:  no cords, erythema SVE:  4/80/-2, AROM clear FHTs:  130, good STV, NST R Toco:  qocc   A/P   Post term induction.  A2 GDM (pt had borderline hgbA1C early  pregnancy).  GBS neg.  GC/CT could not be confirmed done during this pregnancy- done today.  Asmi Fugere A

## 2013-03-27 NOTE — Progress Notes (Signed)
Patient was referred for history of depression/anxiety. * Referral screened out by Clinical Social Worker because none of the following criteria appear to apply:  ~ History of anxiety/depression during this pregnancy, or of post-partum depression.  ~ Diagnosis of anxiety and/or depression within last 3 years  ~ History of depression due to pregnancy loss/loss of child  OR * Patient's symptoms currently being treated with medication (Celexa) and/or therapy.  Please contact the Clinical Social Worker if needs arise, or by the patient's request.  

## 2013-03-28 LAB — CBC
HCT: 34.8 % — ABNORMAL LOW (ref 36.0–46.0)
Hemoglobin: 11.9 g/dL — ABNORMAL LOW (ref 12.0–15.0)
MCH: 31.5 pg (ref 26.0–34.0)
MCHC: 34.2 g/dL (ref 30.0–36.0)
MCV: 92.1 fL (ref 78.0–100.0)
WBC: 12.3 10*3/uL — ABNORMAL HIGH (ref 4.0–10.5)

## 2013-03-28 LAB — GC/CHLAMYDIA PROBE AMP: GC Probe RNA: NEGATIVE

## 2013-03-28 NOTE — Progress Notes (Signed)
Patient is eating, ambulating, voiding.  Pain control is good.  Filed Vitals:   03/27/13 1331 03/27/13 1410 03/27/13 1510 03/27/13 1949  BP: 100/67 107/68 102/47 112/66  Pulse: 75 72 66 66  Temp:  97.8 F (36.6 C) 98.3 F (36.8 C) 98.4 F (36.9 C)  TempSrc:  Oral Oral Oral  Resp:  16 18 18   Height:      Weight:      SpO2:  97%      Fundus firm Perineum without swelling.  Lab Results  Component Value Date   WBC 9.1 03/27/2013   HGB 12.4 03/27/2013   HCT 35.8* 03/27/2013   MCV 91.1 03/27/2013   PLT 93* 03/27/2013    A/Positive/-- (04/08 0000)/RI  A/P Post partum day 1.  Routine care.  Expect d/c per plan.    Senetra Dillin A

## 2013-03-28 NOTE — Discharge Summary (Addendum)
Obstetric Discharge Summary Reason for Admission: induction of labor Prenatal Procedures: none; A2GDM Intrapartum Procedures: spontaneous vaginal delivery Postpartum Procedures: none Complications-Operative and Postpartum: second degree episiotomy Hemoglobin  Date Value Range Status  03/27/2013 12.4  12.0 - 15.0 g/dL Final     HCT  Date Value Range Status  03/27/2013 35.8* 36.0 - 46.0 % Final    Discharge Diagnoses: Term Pregnancy-delivered  Discharge Information: Date: 03/28/2013 Activity: pelvic rest Diet: routine Medications: Ibuprofen Condition: stable Instructions: refer to practice specific booklet Discharge to: home Follow-up Information   Follow up with Daleyssa Loiselle A, MD In 4 weeks.   Specialty:  Obstetrics and Gynecology   Contact information:   [address] [place] [place] [address] [phone]       Newborn Data: Live born female  Birth Weight: 8 lb 4.5 oz (3755 g) APGAR: 7, 9  Home with mother.  Joey Hudock A 03/28/2013, 4:21 AM

## 2013-06-26 ENCOUNTER — Encounter (HOSPITAL_COMMUNITY)
Admission: RE | Admit: 2013-06-26 | Discharge: 2013-06-26 | Disposition: A | Discharge status: Home / Self Care | Payer: BC Managed Care – PPO | Source: Ambulatory Visit | Attending: Obstetrics and Gynecology | Admitting: Obstetrics and Gynecology

## 2013-06-26 DIAGNOSIS — O923 Agalactia: Secondary | ICD-10-CM | POA: Insufficient documentation

## 2013-07-25 ENCOUNTER — Encounter (HOSPITAL_COMMUNITY)
Admission: RE | Admit: 2013-07-25 | Discharge: 2013-07-25 | Disposition: A | Discharge status: Home / Self Care | Payer: BC Managed Care – PPO | Source: Ambulatory Visit | Attending: Obstetrics and Gynecology | Admitting: Obstetrics and Gynecology

## 2013-07-25 DIAGNOSIS — O923 Agalactia: Secondary | ICD-10-CM | POA: Insufficient documentation

## 2013-08-25 ENCOUNTER — Encounter (HOSPITAL_COMMUNITY)
Admission: RE | Admit: 2013-08-25 | Discharge: 2013-08-25 | Disposition: A | Discharge status: Home / Self Care | Payer: BC Managed Care – PPO | Source: Ambulatory Visit | Attending: Obstetrics and Gynecology | Admitting: Obstetrics and Gynecology

## 2013-08-25 DIAGNOSIS — O923 Agalactia: Secondary | ICD-10-CM | POA: Insufficient documentation

## 2013-09-24 ENCOUNTER — Encounter (HOSPITAL_COMMUNITY)
Admission: RE | Admit: 2013-09-24 | Discharge: 2013-09-24 | Disposition: A | Discharge status: Home / Self Care | Payer: BC Managed Care – PPO | Source: Ambulatory Visit | Attending: Obstetrics and Gynecology | Admitting: Obstetrics and Gynecology

## 2013-09-24 DIAGNOSIS — O923 Agalactia: Secondary | ICD-10-CM | POA: Insufficient documentation

## 2013-10-25 ENCOUNTER — Encounter (HOSPITAL_COMMUNITY)
Admission: RE | Admit: 2013-10-25 | Discharge: 2013-10-25 | Disposition: A | Discharge status: Home / Self Care | Payer: BC Managed Care – PPO | Source: Ambulatory Visit | Attending: Obstetrics and Gynecology | Admitting: Obstetrics and Gynecology

## 2013-10-25 DIAGNOSIS — O923 Agalactia: Secondary | ICD-10-CM | POA: Insufficient documentation

## 2013-11-24 ENCOUNTER — Encounter (HOSPITAL_COMMUNITY)
Admission: RE | Admit: 2013-11-24 | Discharge: 2013-11-24 | Disposition: A | Discharge status: Home / Self Care | Payer: BC Managed Care – PPO | Source: Ambulatory Visit | Attending: Obstetrics and Gynecology | Admitting: Obstetrics and Gynecology

## 2013-11-24 DIAGNOSIS — O923 Agalactia: Secondary | ICD-10-CM | POA: Insufficient documentation

## 2013-12-25 ENCOUNTER — Encounter (HOSPITAL_COMMUNITY)
Admission: RE | Admit: 2013-12-25 | Discharge: 2013-12-25 | Disposition: A | Discharge status: Home / Self Care | Payer: BC Managed Care – PPO | Source: Ambulatory Visit | Attending: Obstetrics and Gynecology | Admitting: Obstetrics and Gynecology

## 2013-12-25 DIAGNOSIS — O923 Agalactia: Secondary | ICD-10-CM | POA: Insufficient documentation

## 2014-01-25 ENCOUNTER — Encounter (HOSPITAL_COMMUNITY)
Admission: RE | Admit: 2014-01-25 | Discharge: 2014-01-25 | Disposition: A | Discharge status: Home / Self Care | Payer: BC Managed Care – PPO | Source: Ambulatory Visit | Attending: Obstetrics and Gynecology | Admitting: Obstetrics and Gynecology

## 2014-01-25 DIAGNOSIS — O923 Agalactia: Secondary | ICD-10-CM | POA: Insufficient documentation

## 2014-02-24 ENCOUNTER — Encounter (HOSPITAL_COMMUNITY)
Admission: RE | Admit: 2014-02-24 | Discharge: 2014-02-24 | Disposition: A | Discharge status: Home / Self Care | Payer: BC Managed Care – PPO | Source: Ambulatory Visit | Attending: Obstetrics and Gynecology | Admitting: Obstetrics and Gynecology

## 2014-02-24 DIAGNOSIS — O923 Agalactia: Secondary | ICD-10-CM | POA: Insufficient documentation

## 2014-03-15 ENCOUNTER — Encounter (HOSPITAL_COMMUNITY): Payer: Self-pay

## 2014-03-27 ENCOUNTER — Encounter (HOSPITAL_COMMUNITY)
Admission: RE | Admit: 2014-03-27 | Discharge: 2014-03-27 | Disposition: A | Discharge status: Home / Self Care | Payer: BC Managed Care – PPO | Source: Ambulatory Visit | Attending: Obstetrics and Gynecology | Admitting: Obstetrics and Gynecology

## 2014-03-27 DIAGNOSIS — O923 Agalactia: Secondary | ICD-10-CM | POA: Insufficient documentation

## 2014-04-26 ENCOUNTER — Encounter (HOSPITAL_COMMUNITY)
Admission: RE | Admit: 2014-04-26 | Discharge: 2014-04-26 | Disposition: A | Discharge status: Home / Self Care | Payer: BC Managed Care – PPO | Source: Ambulatory Visit | Attending: Obstetrics and Gynecology | Admitting: Obstetrics and Gynecology

## 2014-04-26 DIAGNOSIS — O923 Agalactia: Secondary | ICD-10-CM | POA: Insufficient documentation

## 2014-05-27 ENCOUNTER — Encounter (HOSPITAL_COMMUNITY)
Admission: RE | Admit: 2014-05-27 | Discharge: 2014-05-27 | Disposition: A | Discharge status: Home / Self Care | Payer: BLUE CROSS/BLUE SHIELD | Source: Ambulatory Visit | Attending: Obstetrics and Gynecology | Admitting: Obstetrics and Gynecology

## 2014-05-27 DIAGNOSIS — O923 Agalactia: Secondary | ICD-10-CM | POA: Insufficient documentation

## 2014-06-27 ENCOUNTER — Encounter (HOSPITAL_COMMUNITY)
Admission: RE | Admit: 2014-06-27 | Discharge: 2014-06-27 | Disposition: A | Discharge status: Home / Self Care | Payer: BLUE CROSS/BLUE SHIELD | Source: Ambulatory Visit | Attending: Obstetrics and Gynecology | Admitting: Obstetrics and Gynecology

## 2014-06-27 DIAGNOSIS — O923 Agalactia: Secondary | ICD-10-CM | POA: Diagnosis not present

## 2015-02-03 ENCOUNTER — Ambulatory Visit (INDEPENDENT_AMBULATORY_CARE_PROVIDER_SITE_OTHER): Payer: BLUE CROSS/BLUE SHIELD | Admitting: Emergency Medicine

## 2015-02-03 VITALS — BP 118/80 | HR 74 | Temp 97.8°F | Resp 16 | Ht 64.0 in | Wt 143.0 lb

## 2015-02-03 DIAGNOSIS — R0789 Other chest pain: Secondary | ICD-10-CM | POA: Diagnosis not present

## 2015-02-03 LAB — COMPREHENSIVE METABOLIC PANEL
ALBUMIN: 4.6 g/dL (ref 3.6–5.1)
ALK PHOS: 44 U/L (ref 33–115)
ALT: 10 U/L (ref 6–29)
AST: 14 U/L (ref 10–30)
BILIRUBIN TOTAL: 0.5 mg/dL (ref 0.2–1.2)
BUN: 18 mg/dL (ref 7–25)
CO2: 28 mmol/L (ref 20–31)
CREATININE: 0.81 mg/dL (ref 0.50–1.10)
Calcium: 9.3 mg/dL (ref 8.6–10.2)
Chloride: 101 mmol/L (ref 98–110)
Glucose, Bld: 87 mg/dL (ref 65–99)
Potassium: 4.2 mmol/L (ref 3.5–5.3)
SODIUM: 142 mmol/L (ref 135–146)
TOTAL PROTEIN: 6.9 g/dL (ref 6.1–8.1)

## 2015-02-03 LAB — CBC
HEMATOCRIT: 40.1 % (ref 36.0–46.0)
Hemoglobin: 13.5 g/dL (ref 12.0–15.0)
MCH: 29.9 pg (ref 26.0–34.0)
MCHC: 33.7 g/dL (ref 30.0–36.0)
MCV: 88.7 fL (ref 78.0–100.0)
MPV: 10.7 fL (ref 8.6–12.4)
Platelets: 259 10*3/uL (ref 150–400)
RBC: 4.52 MIL/uL (ref 3.87–5.11)
RDW: 13 % (ref 11.5–15.5)
WBC: 5.7 10*3/uL (ref 4.0–10.5)

## 2015-02-03 LAB — D-DIMER, QUANTITATIVE (NOT AT ARMC): D DIMER QUANT: 0.27 ug{FEU}/mL (ref 0.00–0.48)

## 2015-02-03 MED ORDER — GI COCKTAIL ~~LOC~~
30.0000 mL | Freq: Once | ORAL | Status: AC
  Administered 2015-02-03: 30 mL via ORAL

## 2015-02-03 NOTE — Progress Notes (Signed)
Subjective:  Patient ID: Kathryn [name], female    DOB: Sep 21, 1980  Age: 34 y.o. MRN: [id number]  CC: Chest Pain   HPI Kathryn [name] presents  with epigastric and lower chest pain. She denies any fever chills nausea nausea or vomiting. No dyspepsia. No symptoms suggestive of reflux. He has no cough wheezing or shortness of breath. Her pain is not affected by activity. She has no history of travel or immobilization. She did travel to [place] 2 weeks ago. He has no pain in her legs were swelling of her calf or ankles. She has no history of injury or overuse. She drinks coffee daily. She is not a smoker has no history of hypertension diabetes or high cholesterol. She had some liver dysfunction during pregnancy. But that apparently is resolved. Her last pregnancy 4 years ago  History Kathryn [name] has a past medical history of Anxiety; Obsessive compulsive disorder; Depression; HELLP syndrome, currently pregnant; and Gestational diabetes.   She has past surgical history that includes Wisdom tooth extraction and No past surgeries.   Her  family history includes Alcohol abuse in her father; Cancer in her maternal grandmother; Diabetes in her maternal grandfather; Heart disease in her maternal grandfather. There is no history of Arthritis, Asthma, Birth defects, Early death, Drug abuse, Depression, COPD, Hearing loss, Hyperlipidemia, Hypertension, Learning disabilities, Kidney disease, Mental illness, Mental retardation, Miscarriages / Stillbirths, Stroke, Vision loss, or Varicose Veins.  She   reports that she has never smoked. She has never used smokeless tobacco. She reports that she drinks alcohol. She reports that she does not use illicit drugs.  Outpatient Prescriptions Prior to Visit  Medication Sig Dispense Refill  . citalopram (CELEXA) 20 MG tablet Take 20 mg by mouth at bedtime.    . famotidine (PEPCID) 20 MG tablet Take 20 mg by mouth 2 (two) times daily as needed for heartburn.    .  Prenatal Vit-Fe Fumarate-FA (PRENATAL MULTIVITAMIN) TABS tablet Take 1 tablet by mouth at bedtime.     No facility-administered medications prior to visit.    Social History   Social History  . Marital Status: Married    Spouse Name: N/A  . Number of Children: N/A  . Years of Education: N/A   Social History Main Topics  . Smoking status: Never Smoker   . Smokeless tobacco: Never Used  . Alcohol Use: Yes     Comment: social  . Drug Use: No  . Sexual Activity: Yes    Birth Control/ Protection: None   Other Topics Concern  . None   Social History Narrative     Review of Systems  Constitutional: Negative for fever, chills and appetite change.  HENT: Negative for congestion, ear pain, postnasal drip, sinus pressure and sore throat.   Eyes: Negative for pain and redness.  Respiratory: Negative for cough, shortness of breath and wheezing.   Cardiovascular: Negative for leg swelling.  Gastrointestinal: Negative for nausea, vomiting, abdominal pain, diarrhea, constipation and blood in stool.  Endocrine: Negative for polyuria.  Genitourinary: Negative for dysuria, urgency, frequency and flank pain.  Musculoskeletal: Negative for gait problem.  Skin: Negative for rash.  Neurological: Negative for weakness and headaches.  Psychiatric/Behavioral: Negative for confusion and decreased concentration. The patient is not nervous/anxious.     Objective:  BP 118/80 mmHg  Pulse 74  Temp(Src) 97.8 F (36.6 C) (Oral)  Resp 16  Ht  (1.626 m)  Wt 143 lb (64.864 kg)  BMI 24.53 kg/m2  SpO2 99%  LMP  01/24/2015  Physical Exam  Constitutional: She is oriented to person, place, and time. She appears well-developed and well-nourished. No distress.  HENT:  Head: Normocephalic and atraumatic.  Right Ear: External ear normal.  Left Ear: External ear normal.  Nose: Nose normal.  Eyes: Conjunctivae and EOM are normal. Pupils are equal, round, and reactive to light. No scleral icterus.    Neck: Normal range of motion. Neck supple. No tracheal deviation present.  Cardiovascular: Normal rate, regular rhythm and normal heart sounds.   Pulmonary/Chest: Effort normal. No respiratory distress. She has no wheezes. She has no rales.  Abdominal: She exhibits no mass. There is no tenderness. There is no rebound and no guarding.  Musculoskeletal: She exhibits no edema.  Lymphadenopathy:    She has no cervical adenopathy.  Neurological: She is alert and oriented to person, place, and time. Coordination normal.  Skin: Skin is warm and dry. No rash noted.  Psychiatric: She has a normal mood and affect. Her behavior is normal.      Assessment & Plan:   Cherri was seen today for chest pain.  Diagnoses and all orders for this visit:  Other chest pain -     EKG 12-Lead -     CBC -     Comprehensive metabolic panel -     D-dimer, quantitative (not at [facility]) -     H. pylori breath test -     gi cocktail (Maalox,Lidocaine,Donnatal); Take 30 mLs by mouth once.   I am having Ms. Henner maintain her prenatal multivitamin, citalopram, and famotidine. We administered gi cocktail.  Meds ordered this encounter  Medications  . gi cocktail (Maalox,Lidocaine,Donnatal)    Sig:    She had no improvement in her discomfort with the GI cocktail. We'll see what her lab work looks like if she continues to have pain after resuming her H2 blocker will do an ultrasound of gallbladder  Appropriate red flag conditions were discussed with the patient as well as actions that should be taken.  Patient expressed his understanding.  Follow-up: Return if symptoms worsen or fail to improve.  Carmelina Dane, MD

## 2015-02-03 NOTE — Patient Instructions (Signed)

## 2015-02-04 LAB — H. PYLORI BREATH TEST: H. PYLORI BREATH TEST: NOT DETECTED

## 2015-05-06 IMAGING — US US OB COMP +14 WK
2 series · 12 of 28 positions shown · non-contrast
Comparison: none

[Series 1: us ob comp +14 wk · 59 acquisitions, 10 frames shown (1 of 2)]
[im 3/59]
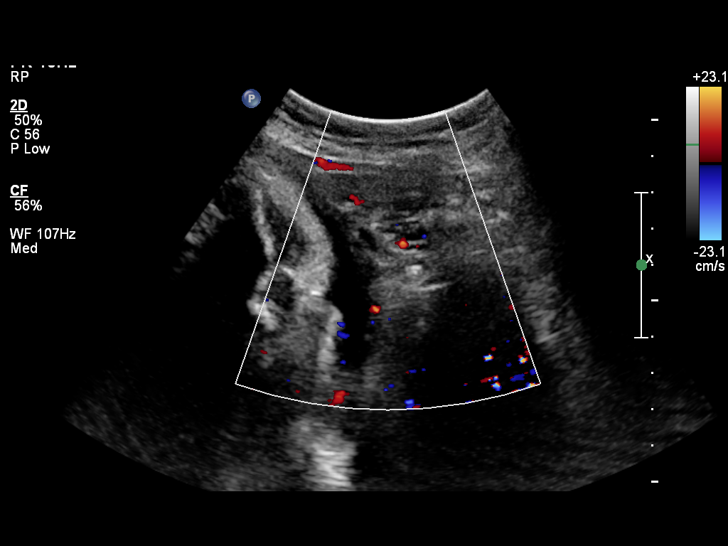
[im 8/59]
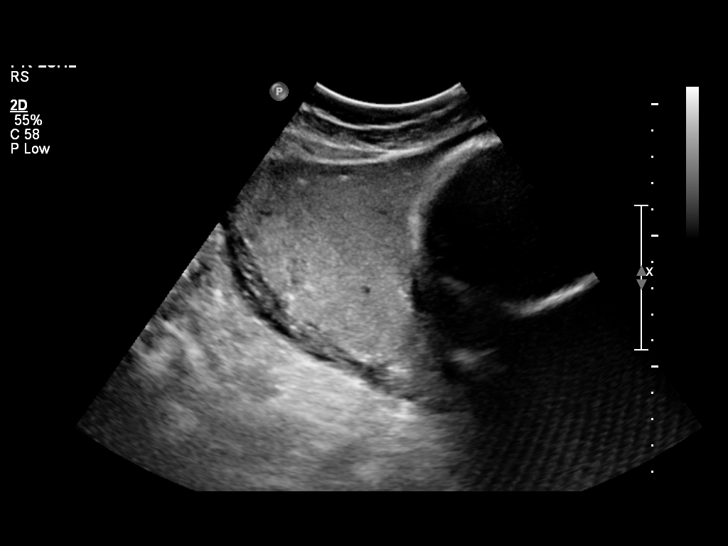
[im 13/59]
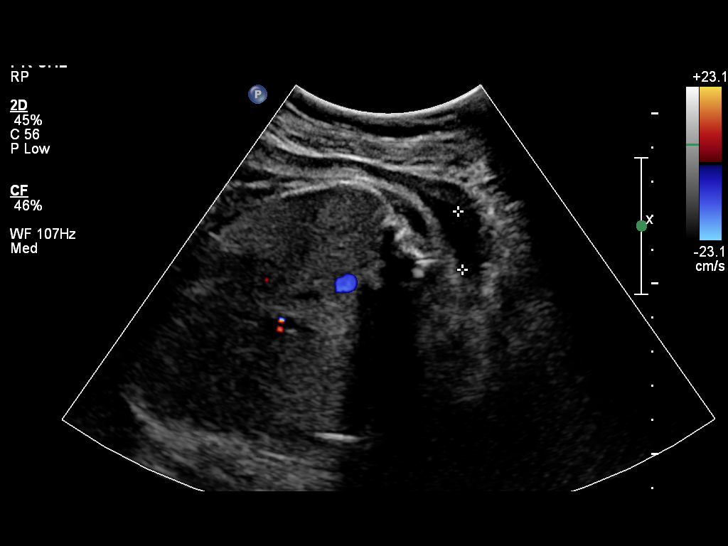
[im 21/59]
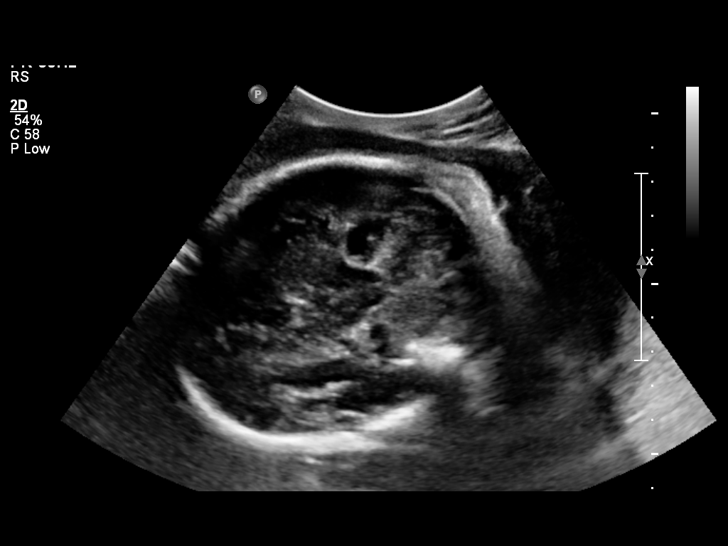
[im 26/59]
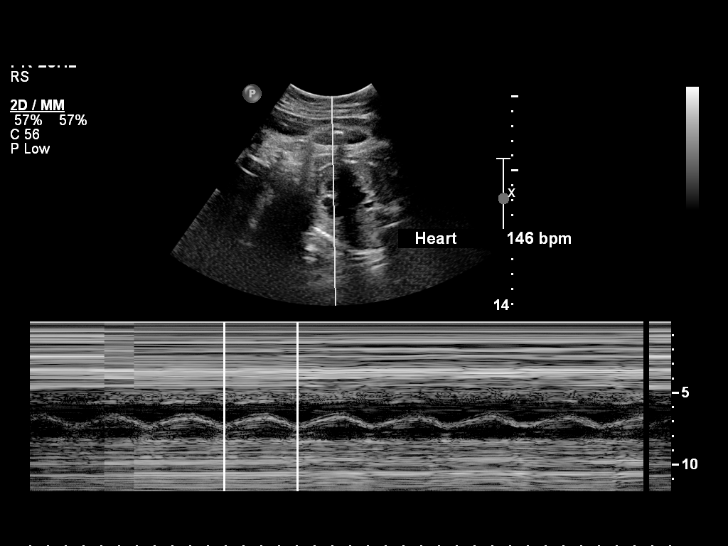
[im 31/59]
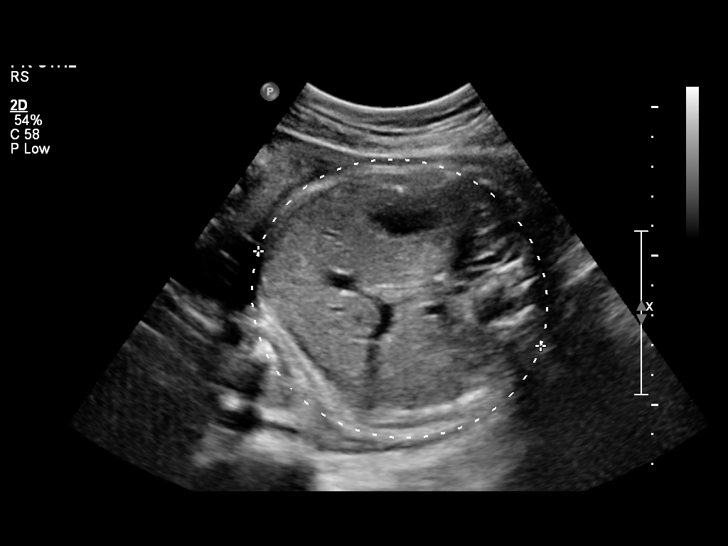
[im 38/59]
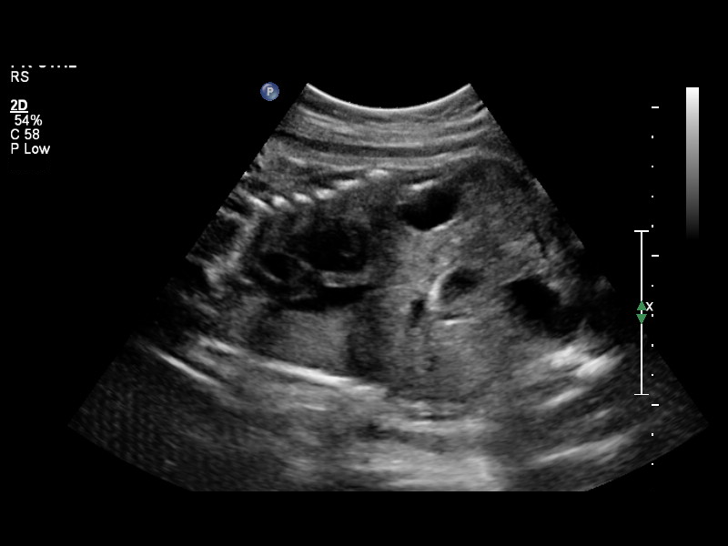
[im 43/59]
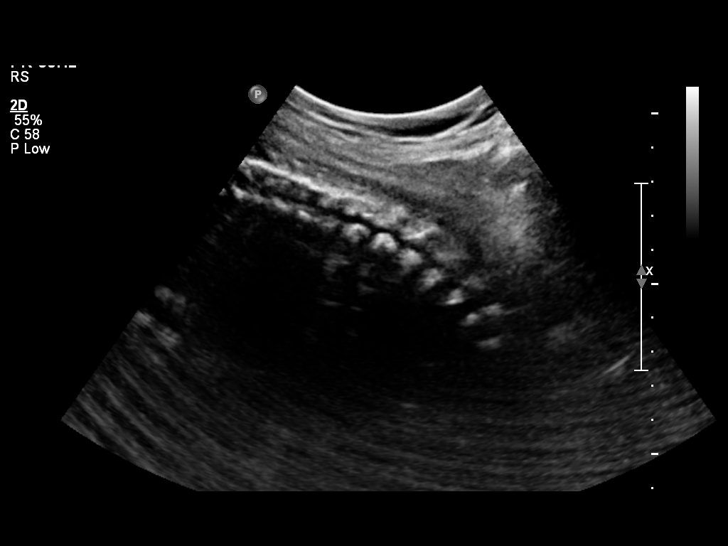
[im 48/59]
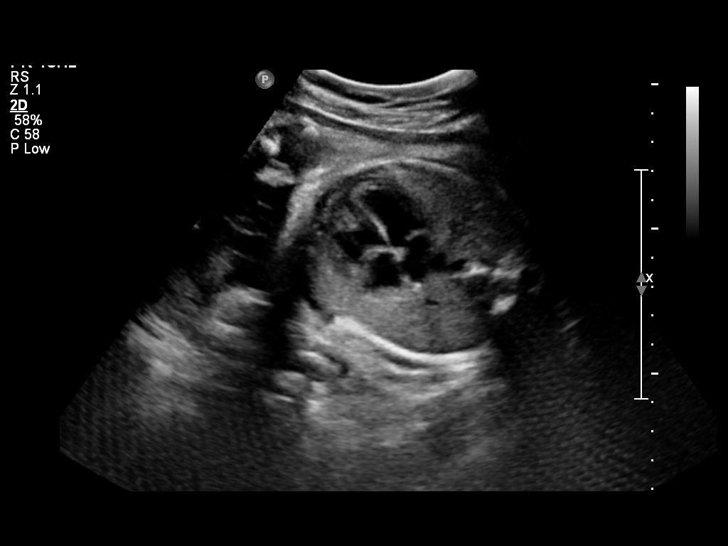
[im 56/59]
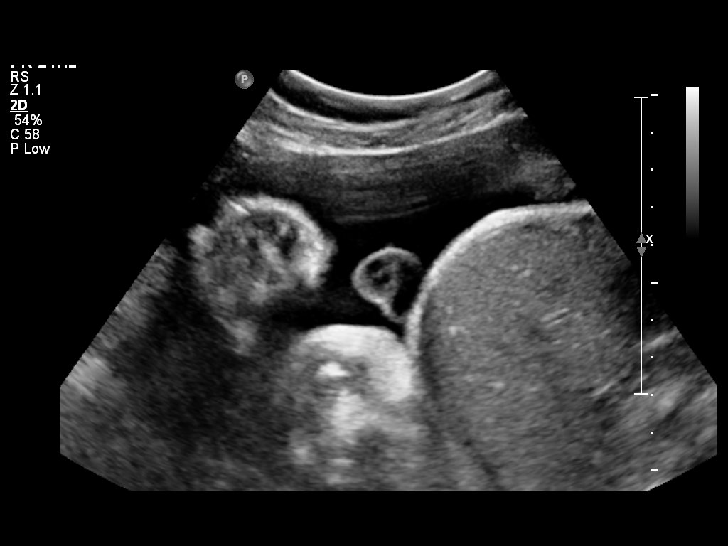

[Series 1: us ob comp +14 wk · 11 acquisitions, 2 frames shown (2 of 2)]
[im 1/11]
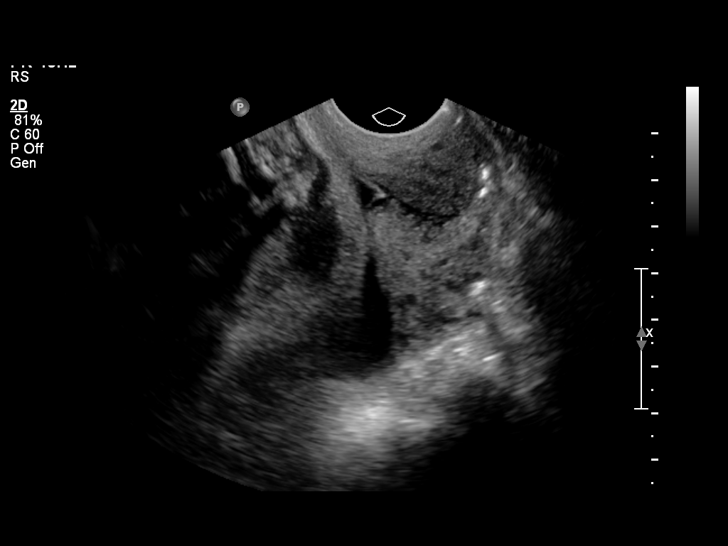
[im 7/11]
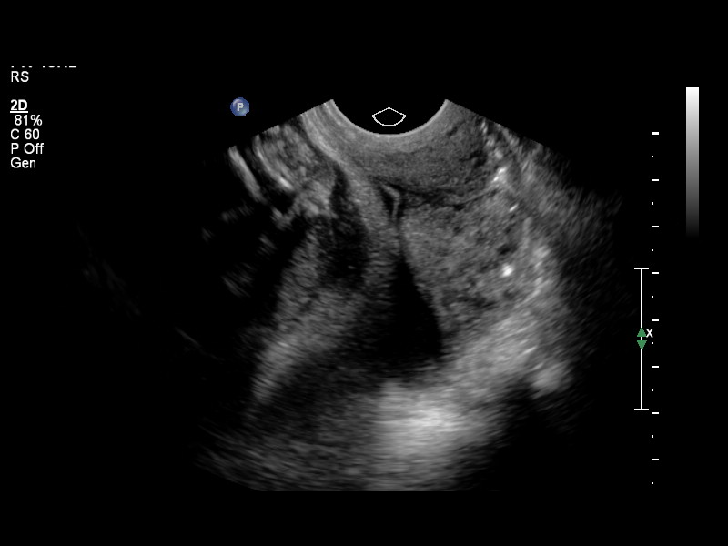

[12 of 28 positions shown; findings below may reference images not displayed]

OBSTETRICS REPORT
                    (Corrected Final 02/05/2013 [DATE])

Service(s) Provided

 US OB COMP + 14 WK                                    76805.1
 US OB TRANSVAGINAL                                    76817.0
Indications

 Vaginal bleeding, unknown etiology
 Poor obstetric history: Previous preeclampsia /
 eclampsia/gestational HTN
 Diabetes - Gestational, A2 (medication controlled)
Fetal Evaluation

 Num Of Fetuses:    1
 Fetal Heart Rate:  146                          bpm
 Cardiac Activity:  Observed
 Presentation:      Frank breech
 Placenta:          Posterior Fundal, above
                    cervical os
 P. Cord            Visualized, central
 Insertion:

 Amniotic Fluid
 AFI FV:      Subjectively within normal limits
 AFI Sum:     10.75   cm       23  %Tile     Larg Pckt:    3.72  cm
 RUQ:   3.72    cm   RLQ:    2.31   cm    LUQ:   3.01    cm   LLQ:    1.71   cm
Biometry

 BPD:       81  mm     G. Age:  32w 4d                CI:        71.11   70 - 86
                                                      FL/HC:      21.2   19.9 -

 HC:       306  mm     G. Age:  34w 1d       45  %    HC/AC:      1.01   0.96 -

 AC:     302.1  mm     G. Age:  34w 1d       84  %    FL/BPD:     80.2   71 - 87
 FL:        65  mm     G. Age:  33w 4d       56  %    FL/AC:      21.5   20 - 24
 HUM:     56.7  mm     G. Age:  33w 0d       59  %

 Est. FW:    5594  gm      5 lb 1 oz     76  %
Gestational Age

 Clinical EDD:  32w 6d                                        EDD:   03/26/13
 U/S Today:     33w 4d                                        EDD:   03/21/13
 Best:          32w 6d     Det. By:  Clinical EDD             EDD:   03/26/13
Anatomy

 Cranium:          Appears normal         Aortic Arch:      Not well visualized
 Fetal Cavum:      Appears normal         Ductal Arch:      Not well visualized
 Ventricles:       Appears normal         Diaphragm:        Appears normal
 Choroid Plexus:   Appears normal         Stomach:          Appears normal, left
                                                            sided
 Cerebellum:       Appears normal         Abdomen:          Appears normal
 Posterior Fossa:  Not well visualized    Abdominal Wall:   Not well visualized
 Nuchal Fold:      Not applicable (>20    Cord Vessels:     Appears normal (3
                   wks GA)                                  vessel cord)
 Face:             Profile appears        Kidneys:          Appear normal
                   normal
 Lips:             Appears normal         Bladder:          Appears normal
 Heart:            Appears normal         Spine:            Not well visualized
                   (4CH, axis, and
                   situs)
 RVOT:             Not well visualized    Lower             Visualized
                                          Extremities:
 LVOT:             Not well visualized    Upper             Visualized
                                          Extremities:

 Other:  Gender not well visualized. (female per pt) Technically difficult due to
         advanced GA and fetal position.
Cervix Uterus Adnexa

 Cervix:       Dynamic cervix ranging from 1.7 cm- 2.9 cm (1.7 was
               with Marquis Alejandre)
 Uterus:       No abnormality visualized.
 Left Ovary:    No adnexal mass visualized.
 Right Ovary:   No adnexal mass visualized.

 Adnexa:     No abnormality visualized.
Impression

 Single IUP at 32 [DATE] weeks
 Fetal growth is appropriate (76th %tile)
 Views of the fetal anatomy somewhat limited due to late
 gestational age.  No gross fetal anomalies identified.
 Frank breech
 Posterior fundal placenta - no subchorionic fluid collections
 noted (no ultrasonographic evidence of abruption)
 Normal anatomic fetal survey

 TVUS - some dynamic changes noted - 1.7 cm with valsalva
Recommendations

 Would consider courese of [place] given dynamic
 cervical changes
 Follow-up ultrasounds as clinically indicated.

 questions or concerns.
                 Attending Physician, NOSA

## 2017-02-08 ENCOUNTER — Other Ambulatory Visit: Payer: Self-pay | Admitting: Obstetrics and Gynecology

## 2017-02-08 DIAGNOSIS — L989 Disorder of the skin and subcutaneous tissue, unspecified: Secondary | ICD-10-CM

## 2017-03-07 ENCOUNTER — Ambulatory Visit
Admission: RE | Admit: 2017-03-07 | Discharge: 2017-03-07 | Disposition: A | Discharge status: Home / Self Care | Payer: 59 | Source: Ambulatory Visit | Attending: Obstetrics and Gynecology | Admitting: Obstetrics and Gynecology

## 2017-03-07 DIAGNOSIS — L989 Disorder of the skin and subcutaneous tissue, unspecified: Secondary | ICD-10-CM

## 2017-03-08 ENCOUNTER — Other Ambulatory Visit: Payer: BLUE CROSS/BLUE SHIELD

## 2019-02-11 ENCOUNTER — Other Ambulatory Visit: Payer: Self-pay

## 2019-02-11 DIAGNOSIS — Z20822 Contact with and (suspected) exposure to covid-19: Secondary | ICD-10-CM

## 2019-02-12 LAB — NOVEL CORONAVIRUS, NAA: SARS-CoV-2, NAA: NOT DETECTED

## 2019-03-02 ENCOUNTER — Other Ambulatory Visit: Payer: Self-pay

## 2019-03-02 DIAGNOSIS — Z20822 Contact with and (suspected) exposure to covid-19: Secondary | ICD-10-CM

## 2019-03-03 LAB — NOVEL CORONAVIRUS, NAA: SARS-CoV-2, NAA: NOT DETECTED

## 2019-04-06 ENCOUNTER — Other Ambulatory Visit: Payer: Self-pay

## 2019-04-06 DIAGNOSIS — Z20822 Contact with and (suspected) exposure to covid-19: Secondary | ICD-10-CM

## 2019-04-08 LAB — NOVEL CORONAVIRUS, NAA: SARS-CoV-2, NAA: NOT DETECTED

## 2019-04-22 ENCOUNTER — Other Ambulatory Visit: Payer: Self-pay

## 2019-04-22 DIAGNOSIS — Z20822 Contact with and (suspected) exposure to covid-19: Secondary | ICD-10-CM

## 2019-04-24 LAB — NOVEL CORONAVIRUS, NAA: SARS-CoV-2, NAA: NOT DETECTED

## 2019-04-29 ENCOUNTER — Ambulatory Visit: Payer: BC Managed Care – PPO | Attending: Internal Medicine

## 2019-04-29 ENCOUNTER — Other Ambulatory Visit: Payer: Self-pay

## 2019-04-29 DIAGNOSIS — Z20822 Contact with and (suspected) exposure to covid-19: Secondary | ICD-10-CM

## 2019-04-30 ENCOUNTER — Other Ambulatory Visit: Payer: Self-pay

## 2019-05-01 LAB — NOVEL CORONAVIRUS, NAA: SARS-CoV-2, NAA: NOT DETECTED

## 2019-06-15 ENCOUNTER — Ambulatory Visit: Payer: BC Managed Care – PPO

## 2019-06-22 ENCOUNTER — Ambulatory Visit: Payer: BC Managed Care – PPO | Attending: Internal Medicine

## 2019-06-22 DIAGNOSIS — Z23 Encounter for immunization: Secondary | ICD-10-CM | POA: Insufficient documentation

## 2019-06-22 NOTE — Progress Notes (Signed)
   Covid-19 Vaccination Clinic  Name:  Kathryn [name]    MRN: [id number] DOB: 12-Jan-1981  06/22/2019  [name] was observed post Covid-19 immunization for 15 minutes without incidence. She was provided with Vaccine Information Sheet and instruction to access the V-Safe system.   [name] was instructed to call 911 with any severe reactions post vaccine: Marland Kitchen Difficulty breathing  . Swelling of your face and throat  . A fast heartbeat  . A bad rash all over your body  . Dizziness and weakness    Immunizations Administered    Name Date Dose VIS Date Route   Pfizer COVID-19 Vaccine 06/22/2019  6:32 PM 0.3 mL 04/24/2019 Intramuscular   Manufacturer: [employer], [employer]   Lot: [id number]   NDC: [address]-3

## 2019-06-25 ENCOUNTER — Ambulatory Visit: Payer: BC Managed Care – PPO | Attending: Internal Medicine

## 2019-06-25 DIAGNOSIS — Z20822 Contact with and (suspected) exposure to covid-19: Secondary | ICD-10-CM

## 2019-06-26 LAB — NOVEL CORONAVIRUS, NAA: SARS-CoV-2, NAA: NOT DETECTED

## 2019-07-14 ENCOUNTER — Ambulatory Visit: Payer: BC Managed Care – PPO | Attending: Internal Medicine

## 2019-07-14 DIAGNOSIS — Z20822 Contact with and (suspected) exposure to covid-19: Secondary | ICD-10-CM

## 2019-07-15 LAB — NOVEL CORONAVIRUS, NAA: SARS-CoV-2, NAA: NOT DETECTED

## 2019-07-18 ENCOUNTER — Ambulatory Visit: Payer: BC Managed Care – PPO | Attending: Internal Medicine

## 2019-07-18 DIAGNOSIS — Z23 Encounter for immunization: Secondary | ICD-10-CM | POA: Insufficient documentation

## 2019-07-18 NOTE — Progress Notes (Signed)
   Covid-19 Vaccination Clinic  Name:  Kathryn [name]    MRN: [id number] DOB: June 29, 1980  07/18/2019  Kathryn [name] was observed post Covid-19 immunization for 15 minutes without incident. She was provided with Vaccine Information Sheet and instruction to access the V-Safe system.   Kathryn [name] was instructed to call 911 with any severe reactions post vaccine: Marland Kitchen Difficulty breathing  . Swelling of face and throat  . A fast heartbeat  . A bad rash all over body  . Dizziness and weakness   Immunizations Administered    Name Date Dose VIS Date Route   Pfizer COVID-19 Vaccine 07/18/2019  8:13 AM 0.3 mL 04/24/2019 Intramuscular   Manufacturer: [employer], [employer]   Lot: [id number]   NDC: [address]-1

## 2020-04-15 ENCOUNTER — Ambulatory Visit: Payer: Self-pay | Attending: Internal Medicine

## 2020-04-15 DIAGNOSIS — Z23 Encounter for immunization: Secondary | ICD-10-CM

## 2020-04-15 NOTE — Progress Notes (Signed)
   Covid-19 Vaccination Clinic  Name:  Kathryn [name]    MRN: [id number] DOB: [birth year]  04/15/2020  [name] was observed post Covid-19 immunization for 15 minutes without incident. She was provided with Vaccine Information Sheet and instruction to access the V-Safe system.   [name] was instructed to call 911 with any severe reactions post vaccine: Marland Kitchen Difficulty breathing  . Swelling of face and throat  . A fast heartbeat  . A bad rash all over body  . Dizziness and weakness   Immunizations Administered    Name Date Dose VIS Date Route   Pfizer COVID-19 Vaccine 04/15/2020  4:50 PM 0.3 mL 03/02/2020 Intramuscular   Manufacturer: [employer], [employer]   Lot: [id number]   NDC: [address]-2

## 2020-11-16 ENCOUNTER — Other Ambulatory Visit: Payer: Self-pay | Admitting: Obstetrics and Gynecology

## 2020-11-16 DIAGNOSIS — R928 Other abnormal and inconclusive findings on diagnostic imaging of breast: Secondary | ICD-10-CM

## 2020-12-06 ENCOUNTER — Ambulatory Visit
Admission: RE | Admit: 2020-12-06 | Discharge: 2020-12-06 | Disposition: A | Discharge status: Home / Self Care | Payer: 59 | Source: Ambulatory Visit | Attending: Obstetrics and Gynecology | Admitting: Obstetrics and Gynecology

## 2020-12-06 ENCOUNTER — Other Ambulatory Visit: Payer: Self-pay | Admitting: Obstetrics and Gynecology

## 2020-12-06 ENCOUNTER — Ambulatory Visit: Payer: Self-pay

## 2020-12-06 ENCOUNTER — Other Ambulatory Visit: Payer: Self-pay

## 2020-12-06 DIAGNOSIS — R921 Mammographic calcification found on diagnostic imaging of breast: Secondary | ICD-10-CM

## 2020-12-06 DIAGNOSIS — R928 Other abnormal and inconclusive findings on diagnostic imaging of breast: Secondary | ICD-10-CM

## 2021-06-09 ENCOUNTER — Other Ambulatory Visit: Payer: 59

## 2021-06-26 ENCOUNTER — Ambulatory Visit
Admission: RE | Admit: 2021-06-26 | Discharge: 2021-06-26 | Disposition: A | Discharge status: Home / Self Care | Payer: 59 | Source: Ambulatory Visit | Attending: Obstetrics and Gynecology | Admitting: Obstetrics and Gynecology

## 2021-06-26 ENCOUNTER — Other Ambulatory Visit: Payer: Self-pay | Admitting: Obstetrics and Gynecology

## 2021-06-26 DIAGNOSIS — R921 Mammographic calcification found on diagnostic imaging of breast: Secondary | ICD-10-CM

## 2021-06-26 DIAGNOSIS — R928 Other abnormal and inconclusive findings on diagnostic imaging of breast: Secondary | ICD-10-CM

## 2021-12-25 ENCOUNTER — Ambulatory Visit
Admission: RE | Admit: 2021-12-25 | Discharge: 2021-12-25 | Disposition: A | Discharge status: Home / Self Care | Payer: Managed Care, Other (non HMO) | Source: Ambulatory Visit | Attending: Obstetrics and Gynecology | Admitting: Obstetrics and Gynecology

## 2021-12-25 ENCOUNTER — Other Ambulatory Visit: Payer: Self-pay | Admitting: Obstetrics and Gynecology

## 2021-12-25 ENCOUNTER — Ambulatory Visit
Admission: RE | Admit: 2021-12-25 | Discharge: 2021-12-25 | Disposition: A | Discharge status: Home / Self Care | Payer: 59 | Source: Ambulatory Visit | Attending: Obstetrics and Gynecology | Admitting: Obstetrics and Gynecology

## 2021-12-25 ENCOUNTER — Other Ambulatory Visit: Payer: 59

## 2021-12-25 DIAGNOSIS — N6310 Unspecified lump in the right breast, unspecified quadrant: Secondary | ICD-10-CM

## 2021-12-25 DIAGNOSIS — R921 Mammographic calcification found on diagnostic imaging of breast: Secondary | ICD-10-CM

## 2022-06-07 NOTE — Progress Notes (Signed)
Kathryn [name] Phone: [phone] Subjective:   Kathryn [name], am serving as a scribe for Kathryn [name].  I'm seeing this patient by the request  of:  Kathryn Limbo, MD  CC: Low back pain after fall  VPX:[id number]  Kathryn [name] is a 42 y.o. female coming in with complaint of back and tailbone pain. In June patient had pain after over stretching. Pain is in middle of lumbar spine. Lumbar flexion makes her pain worse.   Two weeks ago patient was skiing and fell backwards hitting coccyx. Patient does [employer] and when she tried to do a sit up from a supine position she experienced excruciating pain. Pain has been improving overall.        Past Medical History:  Diagnosis Date   Anxiety    Depression    Gestational diabetes    glyburide   Hx of HELLP syndrome, currently pregnant    Obsessive compulsive disorder    Past Surgical History:  Procedure Laterality Date   [name] PAST SURGERIES     WISDOM TOOTH EXTRACTION     Social History   Socioeconomic History   Marital status: Married    Spouse name: Not on file   Number of children: Not on file   Years of education: Not on file   Highest education level: Not on file  Occupational History   Not on file  Tobacco Use   Smoking status: Never   Smokeless tobacco: Never  Substance and Sexual Activity   Alcohol use: Yes    Comment: social   Drug use: [name]   Sexual activity: Yes    Birth control/protection: None  Other Topics Concern   Not on file  Social History Narrative   Not on file   Social Determinants of Health   Financial Resource Strain: Not on file  Food Insecurity: Not on file  Transportation Needs: Not on file  Physical Activity: Not on file  Stress: Not on file  Social Connections: Not on file   Allergies  Allergen Reactions   Naproxen Other (See Comments)    Stomach pain   Percocet [Oxycodone-Acetaminophen] Itching   Sulfa  Antibiotics Other (See Comments)    Childhood reaction   Family History  Problem Relation Age of Onset   Alcohol abuse Father    Cancer Maternal Grandmother    Diabetes Maternal Grandfather    Heart disease Maternal Grandfather    Arthritis Neg Hx    Asthma Neg Hx    Birth defects Neg Hx    Early death Neg Hx    Drug abuse Neg Hx    Depression Neg Hx    COPD Neg Hx    Hearing loss Neg Hx    Hyperlipidemia Neg Hx    Hypertension Neg Hx    Learning disabilities Neg Hx    Kidney disease Neg Hx    Mental illness Neg Hx    Mental retardation Neg Hx    Miscarriages / Stillbirths Neg Hx    Stroke Neg Hx    Vision loss Neg Hx    Varicose Veins Neg Hx     Current Outpatient Medications (Endocrine & Metabolic):    etonogestrel-ethinyl estradiol (NUVARING) 0.12-0.015 MG/24HR vaginal ring, Place 1 each vaginally every 28 (twenty-eight) days. Insert vaginally and leave in place for 3 consecutive weeks, then remove for 1 week.   predniSONE (DELTASONE) 20 MG tablet, Take 1 tablet (20 mg  total) by mouth daily with breakfast.  Current Outpatient Medications (Cardiovascular):    spironolactone (ALDACTONE) 25 MG tablet, Take 25 mg by mouth daily.     Current Outpatient Medications (Other):    citalopram (CELEXA) 20 MG tablet, Take 20 mg by mouth at bedtime.   gabapentin (NEURONTIN) 100 MG capsule, Take 1 capsule (100 mg total) by mouth at bedtime.    Review of Systems:  [name] headache, visual changes, nausea, vomiting, diarrhea, constipation, dizziness, abdominal pain, skin rash, fevers, chills, night sweats, weight loss, swollen lymph nodes, body aches, joint swelling, chest pain, shortness of breath, mood changes. POSITIVE muscle aches  Objective  Blood pressure 110/82, pulse 62, height 5\' 4"  (1.626 m), weight 125 lb (56.7 kg), SpO2 98 %, unknown if currently breastfeeding.   General: [name] apparent distress alert and oriented x3 mood and affect normal, dressed appropriately.  HEENT:  Pupils equal, extraocular movements intact  Respiratory: Patient's speak in full sentences and does not appear short of breath  Cardiovascular: [name] lower extremity edema, non tender, [name] erythema  Low back exam shows does have some loss of lordosis.  Patient does have some tightness noted with FABER test bilaterally.  Severely tender to palpation over the coccyx.  [name] worsening pain with flexion or extension.  Neurovascularly intact distally.    Impression and Recommendations:     The above documentation has been reviewed and is accurate and complete Kathryn Pulley, DO

## 2022-06-11 ENCOUNTER — Ambulatory Visit (INDEPENDENT_AMBULATORY_CARE_PROVIDER_SITE_OTHER): Payer: Managed Care, Other (non HMO) | Admitting: Family Medicine

## 2022-06-11 ENCOUNTER — Ambulatory Visit (INDEPENDENT_AMBULATORY_CARE_PROVIDER_SITE_OTHER): Payer: Managed Care, Other (non HMO)

## 2022-06-11 VITALS — BP 110/82 | HR 62 | Ht 64.0 in | Wt 125.0 lb

## 2022-06-11 DIAGNOSIS — M533 Sacrococcygeal disorders, not elsewhere classified: Secondary | ICD-10-CM

## 2022-06-11 DIAGNOSIS — M545 Low back pain, unspecified: Secondary | ICD-10-CM | POA: Diagnosis not present

## 2022-06-11 MED ORDER — GABAPENTIN 100 MG PO CAPS
100.0000 mg | ORAL_CAPSULE | Freq: Every day | ORAL | 0 refills | Status: AC
Start: 2022-06-11 — End: ?

## 2022-06-11 MED ORDER — PREDNISONE 20 MG PO TABS: 20.0000 mg | ORAL_TABLET | Freq: Every day | ORAL | 0 refills | Status: DC

## 2022-06-11 NOTE — Patient Instructions (Signed)
Xray today Prednisone 20mg  for 5 days Gabapentin 100mg  at night Vit D 4000iU daily See me in 6 weeks

## 2022-06-11 NOTE — Assessment & Plan Note (Signed)
Traumatic coccydynia.  He is making some improvement though over that time but has been now quite a while.  We discussed with patient about prednisone and gabapentin which was prescribed.  Discussed avoiding hitting area given some mild ACTIVITIES.  WILL GET X-RAYS TO FURTHER EVALUATE FOR ANY TYPE OF CORTICAL IRREGULARITY OR ANY TYPE OF OTHER BONY ABNORMALITY THAT COULD BE CONTRIBUTING TO THE PAIN.  FOLLOW-UP AGAIN IN 6 TO 8 WEEKS.

## 2022-07-09 ENCOUNTER — Other Ambulatory Visit: Payer: Managed Care, Other (non HMO)

## 2022-07-19 ENCOUNTER — Other Ambulatory Visit: Payer: Managed Care, Other (non HMO)

## 2022-07-20 NOTE — Progress Notes (Signed)
[facility] [address] [place] [address] Phone: [phone] Subjective:   Bruce Donath, am serving as a scribe for Dr. Antoine Primas.  I'm seeing this patient by the request  of:  Tracey Harries, MD  CC: low back pain follow up   BMW:[id number]  06/11/2022 Traumatic coccydynia. He is making some improvement though over that time but has been now quite a while. We discussed with patient about prednisone and gabapentin which was prescribed. Discussed avoiding hitting area given some mild ACTIVITIES. WILL GET X-RAYS TO FURTHER EVALUATE FOR ANY TYPE OF CORTICAL IRREGULARITY OR ANY TYPE OF OTHER BONY ABNORMALITY THAT COULD BE CONTRIBUTING TO THE PAIN. FOLLOW-UP AGAIN IN 6 TO 8 WEEKS.   Update 07/23/2022 Kathryn [name] is a 42 y.o. female coming in with complaint of coccydynia. 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 Does not sit on the coccyx or places ball in this area during class.       Past Medical History:  Diagnosis Date   Anxiety    Depression    Gestational diabetes    glyburide   Hx of HELLP syndrome, currently pregnant    Obsessive compulsive disorder    Past Surgical History:  Procedure Laterality Date   NO PAST SURGERIES     WISDOM TOOTH EXTRACTION     Social History   Socioeconomic History   Marital status: Married    Spouse name: Not on file   Number of children: Not on file   Years of education: Not on file   Highest education level: Not on file  Occupational History   Not on file  Tobacco Use   Smoking status: Never   Smokeless tobacco: Never  Substance and Sexual Activity   Alcohol use: Yes    Comment: social   Drug use: No   Sexual activity: Yes    Birth control/protection: None  Other Topics Concern   Not on file  Social History Narrative   Not on file   Social Determinants of Health   Financial Resource Strain: Not on file  Food Insecurity: Not on file   Transportation Needs: Not on file  Physical Activity: Not on file  Stress: Not on file  Social Connections: Not on file   Allergies  Allergen Reactions   Naproxen Other (See Comments)    Stomach pain   Percocet [Oxycodone-Acetaminophen] Itching   Sulfa Antibiotics Other (See Comments)    Childhood reaction   Family History  Problem Relation Age of Onset   Alcohol abuse Father    Cancer Maternal Grandmother    Diabetes Maternal Grandfather    Heart disease Maternal Grandfather    Arthritis Neg Hx    Asthma Neg Hx    Birth defects Neg Hx    Early death Neg Hx    Drug abuse Neg Hx    Depression Neg Hx    COPD Neg Hx    Hearing loss Neg Hx    Hyperlipidemia Neg Hx    Hypertension Neg Hx    Learning disabilities Neg Hx    Kidney disease Neg Hx    Mental illness Neg Hx    Mental retardation Neg Hx    Miscarriages / Stillbirths Neg Hx    Stroke Neg Hx    Vision loss Neg Hx    Varicose Veins Neg Hx     Current Outpatient Medications (Endocrine & Metabolic):  etonogestrel-ethinyl estradiol (NUVARING) 0.12-0.015 MG/24HR vaginal ring, Place 1 each vaginally every 28 (twenty-eight) days. Insert vaginally and leave in place for 3 consecutive weeks, then remove for 1 week.      Current Outpatient Medications (Other):    citalopram (CELEXA) 20 MG tablet, Take 20 mg by mouth at bedtime.   gabapentin (NEURONTIN) 100 MG capsule, Take 1 capsule (100 mg total) by mouth at bedtime.     Objective  Blood pressure 122/84, pulse (!) 52, height 5\' 4"  (1.626 m), weight 126 lb (57.2 kg), SpO2 99 %, unknown if currently breastfeeding.   General: No apparent distress alert and oriented x3 mood and affect normal, dressed appropriately.  HEENT: Pupils equal, extraocular movements intact  Respiratory: Patient's speak in full sentences and does not appear short of breath  Cardiovascular: No lower extremity edema, non tender, no erythema  Low back exam shows mild loss of lordosis   Improvement in range of motion noted.  Still some weakness with hip abductor strength.     Impression and Recommendations:     The above documentation has been reviewed and is accurate and complete Judi Saa, DO

## 2022-07-23 ENCOUNTER — Ambulatory Visit (INDEPENDENT_AMBULATORY_CARE_PROVIDER_SITE_OTHER): Payer: Managed Care, Other (non HMO) | Admitting: Family Medicine

## 2022-07-23 VITALS — BP 122/84 | HR 52 | Ht 64.0 in | Wt 126.0 lb

## 2022-07-23 DIAGNOSIS — M533 Sacrococcygeal disorders, not elsewhere classified: Secondary | ICD-10-CM

## 2022-07-23 NOTE — Assessment & Plan Note (Addendum)
Pain in the bottom is resolved.  Discussed HEP  Patient has made significant improvement at this time can follow-up with me on an as-needed basis.

## 2022-07-23 NOTE — Patient Instructions (Addendum)
Glad you are doing well

## 2022-07-24 ENCOUNTER — Ambulatory Visit
Admission: RE | Admit: 2022-07-24 | Discharge: 2022-07-24 | Disposition: A | Discharge status: Home / Self Care | Payer: Managed Care, Other (non HMO) | Source: Ambulatory Visit | Attending: Obstetrics and Gynecology | Admitting: Obstetrics and Gynecology

## 2022-07-24 ENCOUNTER — Other Ambulatory Visit: Payer: Self-pay | Admitting: Obstetrics and Gynecology

## 2022-07-24 ENCOUNTER — Ambulatory Visit: Admission: RE | Admit: 2022-07-24 | Payer: Managed Care, Other (non HMO) | Source: Ambulatory Visit

## 2022-07-24 ENCOUNTER — Encounter: Payer: Self-pay | Admitting: Obstetrics and Gynecology

## 2022-07-24 DIAGNOSIS — N6310 Unspecified lump in the right breast, unspecified quadrant: Secondary | ICD-10-CM

## 2022-07-24 DIAGNOSIS — N6320 Unspecified lump in the left breast, unspecified quadrant: Secondary | ICD-10-CM

## 2023-03-07 IMAGING — US US BREAST*L* LIMITED INC AXILLA
1 series · 6 of 6 positions shown · non-contrast
Comparison: Recent screening mammography

CLINICAL DATA: The patient was called back for a possible left
breast mass and bilateral breast calcifications.

EXAM:
DIGITAL DIAGNOSTIC BILATERAL MAMMOGRAM WITH TOMOSYNTHESIS AND CAD;
ULTRASOUND LEFT BREAST LIMITED
TECHNIQUE: Bilateral digital diagnostic mammography and breast tomosynthesis
was performed. The images were evaluated with computer-aided
detection.; Targeted ultrasound examination of the left breast was
performed

[Series 1: us breast*left* limited inc axilla · 0.05mm/px · 6 of 6 slices shown]
[im 1/6]
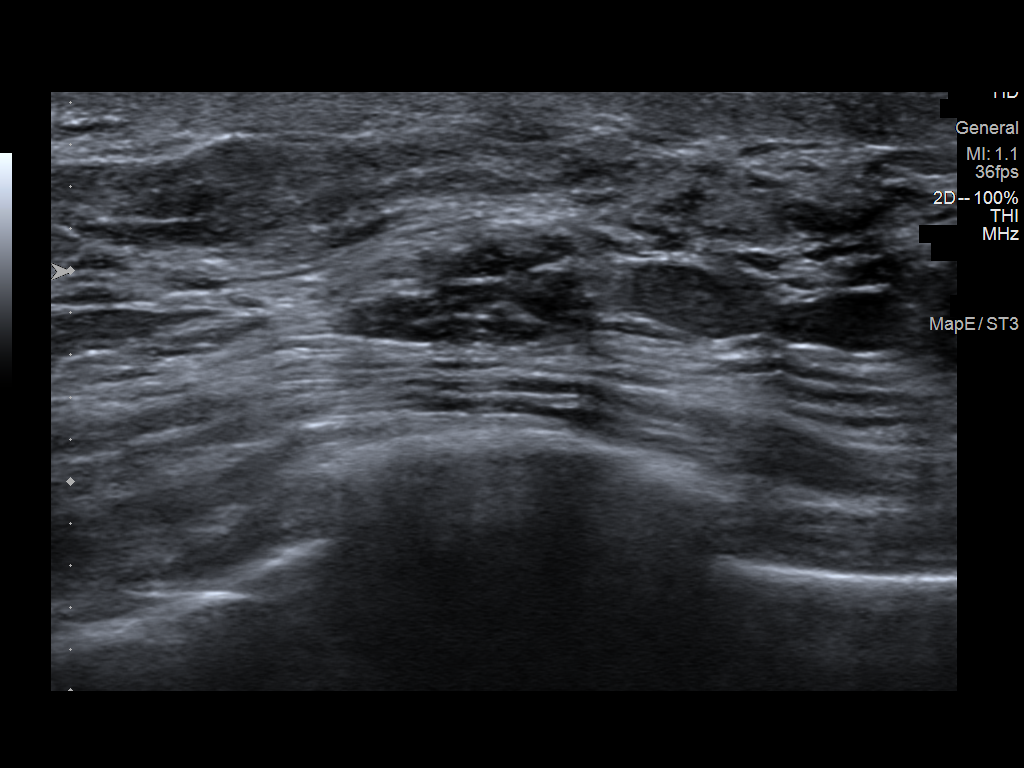
[im 2/6]
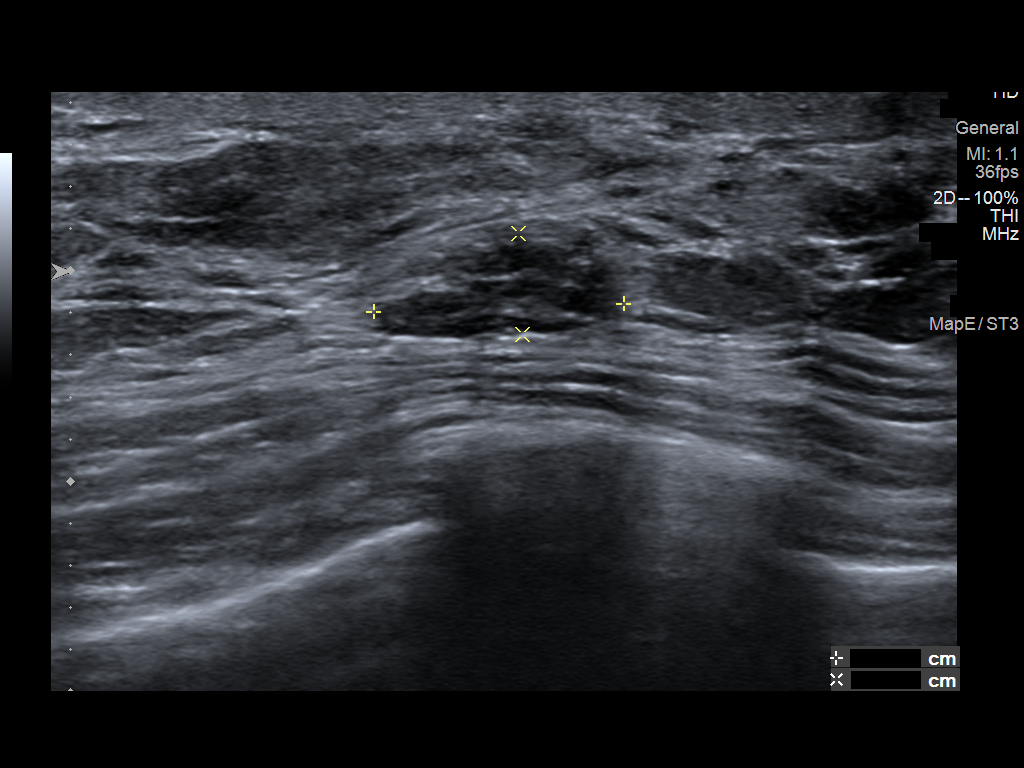
[im 3/6]
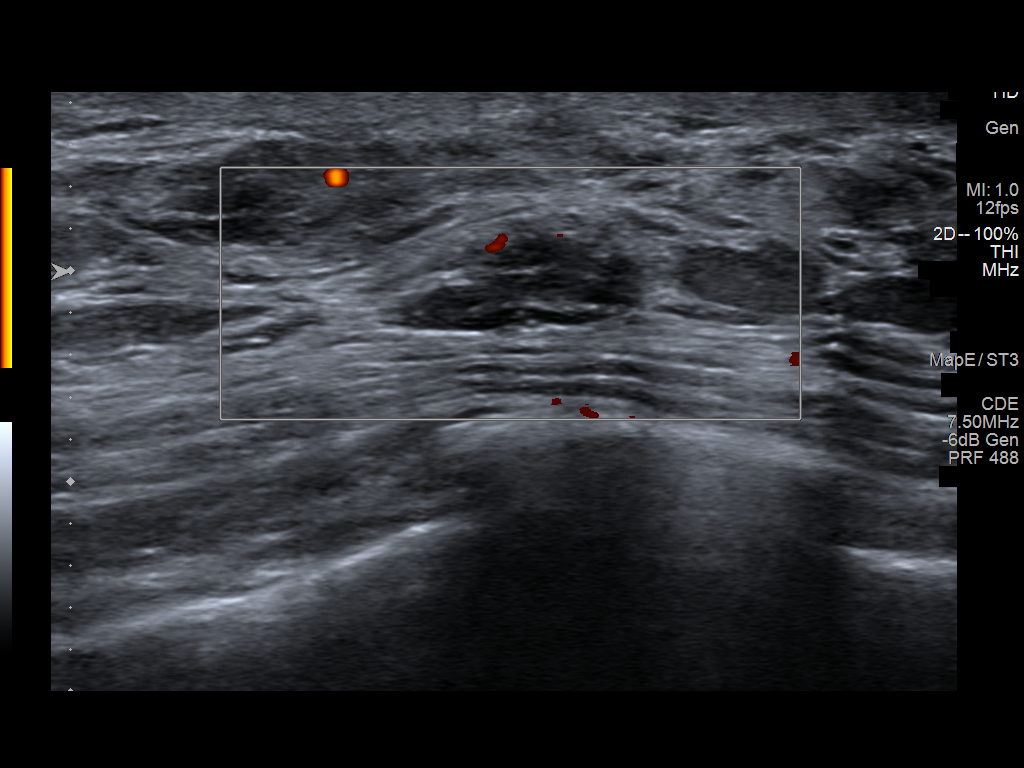
[im 4/6]
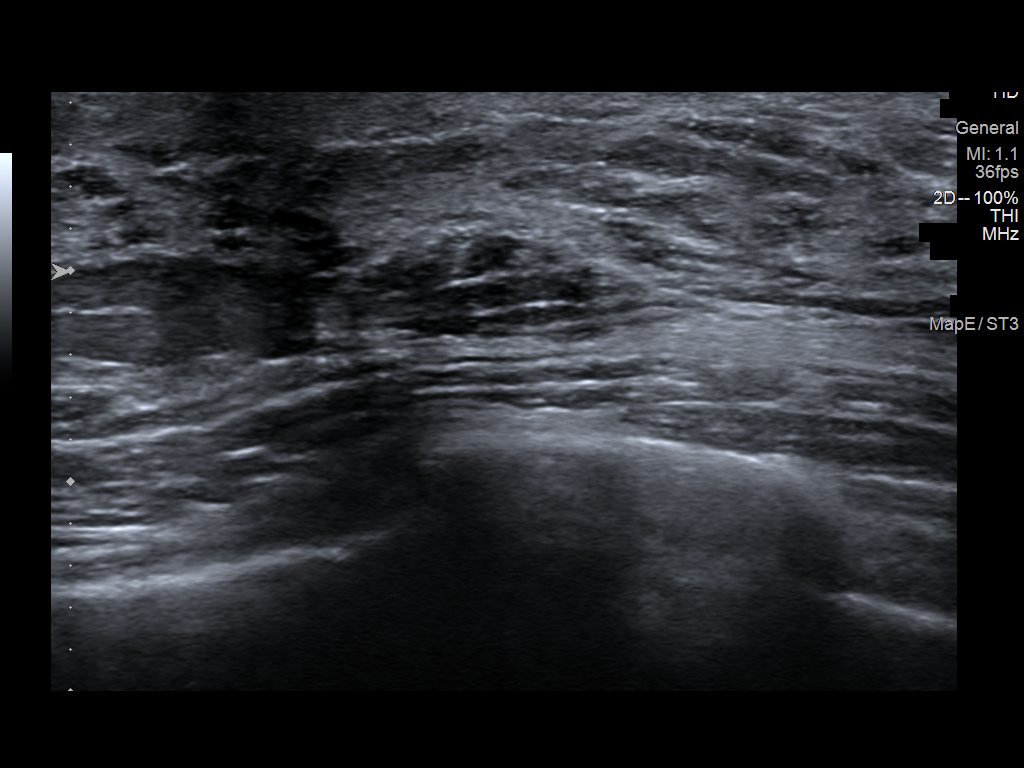
[im 5/6]
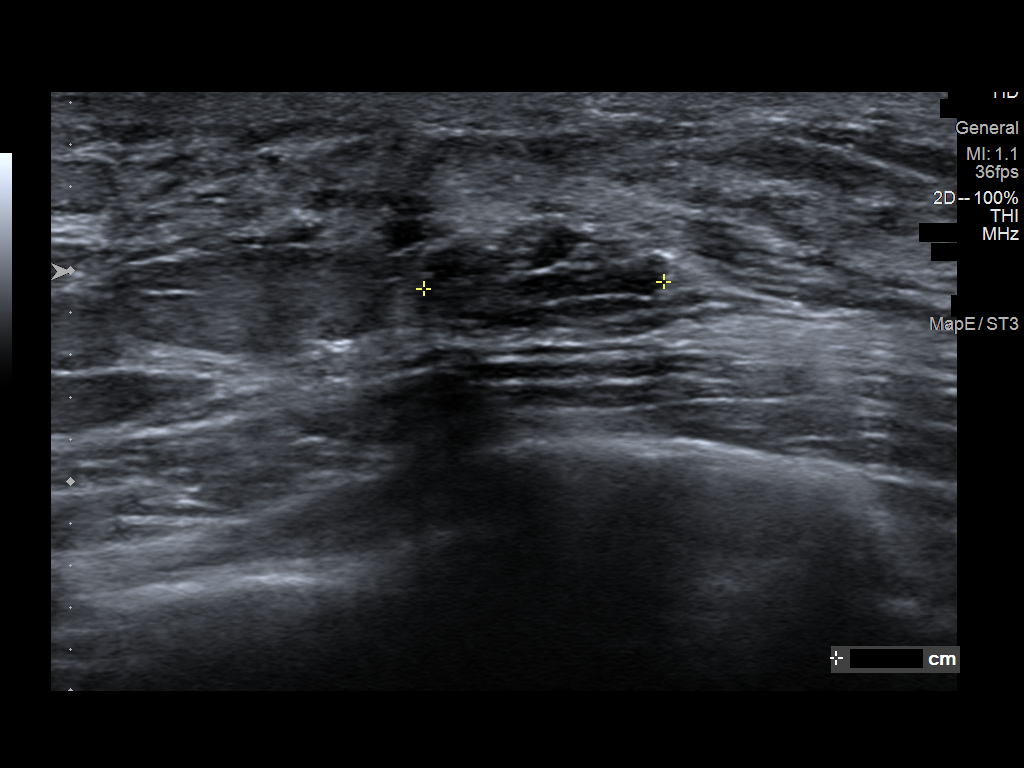
[im 6/6]
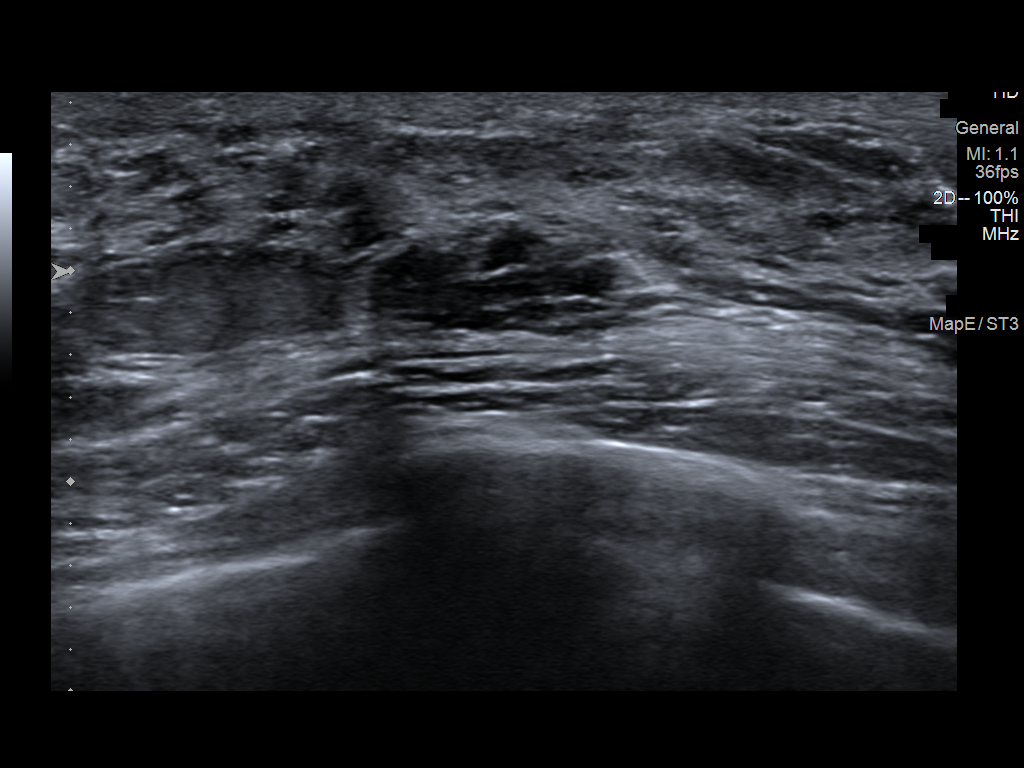

[6 of 6 positions shown; findings below may reference images not displayed]

ACR Breast Density Category c: The breast tissue is heterogeneously
dense, which may obscure small masses.
FINDINGS: There are round and punctate calcifications which are loosely
grouped in the anterior breast bilaterally. These calcifications do
not layer on additional imaging.

The mass in the left breast at approximately 3 o'clock improves but
does not resolve on additional imaging.

On physical exam, no suspicious lumps are identified.

Targeted ultrasound is performed, showing a hypoechoic mass at 3
o'clock in the retroareolar region of the left breast measuring 12 x
5 x 11 mm. This mass is circumscribed based on real-time imaging. On
real-time imaging, I suspect this mass represents either focal
fibrocystic change or fibroadenomatoid change.
IMPRESSION: Probably benign left breast mass. Probably benign bilateral breast
calcifications.

RECOMMENDATION:
Recommend six-month follow-up mammography of the probably benign
left breast mass and the probably benign calcifications. Recommend
left breast ultrasound of the probably benign mass at that time as
well.

I have discussed the findings and recommendations with the patient.
If applicable, a reminder letter will be sent to the patient
regarding the next appointment.

BI-RADS CATEGORY  3: Probably benign.

## 2024-02-12 ENCOUNTER — Other Ambulatory Visit (HOSPITAL_BASED_OUTPATIENT_CLINIC_OR_DEPARTMENT_OTHER): Payer: Self-pay

## 2024-02-12 MED ORDER — ETONOGESTREL-ETHINYL ESTRADIOL 0.12-0.015 MG/24HR VA RING
VAGINAL_RING | VAGINAL | 4 refills | Status: AC
  Filled 2024-02-12 – 2024-06-08 (×2): qty 3, 84d supply, fill #0

## 2024-02-25 ENCOUNTER — Other Ambulatory Visit (HOSPITAL_BASED_OUTPATIENT_CLINIC_OR_DEPARTMENT_OTHER): Payer: Self-pay

## 2024-05-04 ENCOUNTER — Other Ambulatory Visit (HOSPITAL_BASED_OUTPATIENT_CLINIC_OR_DEPARTMENT_OTHER): Payer: Self-pay

## 2024-05-04 ENCOUNTER — Ambulatory Visit: Admitting: Sports Medicine

## 2024-05-04 VITALS — HR 71 | Ht 64.0 in | Wt 126.0 lb

## 2024-05-04 DIAGNOSIS — S298XXA Other specified injuries of thorax, initial encounter: Secondary | ICD-10-CM | POA: Diagnosis not present

## 2024-05-04 DIAGNOSIS — R0789 Other chest pain: Secondary | ICD-10-CM | POA: Diagnosis not present

## 2024-05-04 MED ORDER — MELOXICAM 15 MG PO TABS
ORAL_TABLET | ORAL | 0 refills | Status: AC
  Filled 2024-05-04: qty 30, 30d supply, fill #0

## 2024-05-04 NOTE — Patient Instructions (Signed)
 Breathing HEP  - Start meloxicam  15 mg daily x2 weeks.  If still having pain after 2 weeks, complete 3rd-week of NSAID. May use remaining NSAID as needed once daily for pain control.  Do not to use additional over-the-counter NSAIDs (ibuprofen , naproxen, Advil , Aleve, etc.) while taking prescription NSAIDs.  May use Tylenol  [phone] mg 2 to 3 times a day for breakthrough pain.  Avoid physical activity that aggravates core   4 week follow up

## 2024-05-04 NOTE — Progress Notes (Signed)
 "               Kathryn [name] D.CLEMENTEEN [name] [address] [place] [address] Phone: [phone]   Assessment and Plan:     1. Rib pain (Primary) 2. Contusion of rib, initial encounter -Acute, initial sports medicine visit - Consistent with contusion of right anterior rib cage from fall into bathtub edge - No red flags on physical exam including no flail chest, no shortness of breath.  X-ray from 04/14/2024 was read as no fracture - Start meloxicam  15 mg daily x2 weeks.  If still having pain after 2 weeks, complete 3rd-week of NSAID. May use remaining NSAID as needed once daily for pain control.  Do not to use additional over-the-counter NSAIDs (ibuprofen , naproxen, Advil , Aleve, etc.) while taking prescription NSAIDs.  May use Tylenol  [phone] mg 2 to 3 times a day for breakthrough pain. - Start HEP targeting rib cage, chest wall mobilization - Recommend avoiding physical activity, core workouts that flare pain until reevaluated   15 additional minutes spent for educating Therapeutic Home Exercise Program.  This included exercises focusing on stretching, strengthening, with focus on eccentric aspects.   Long term goals include an improvement in range of motion, strength, endurance as well as avoiding reinjury. Patient's frequency would include in 1-2 times a day, 3-5 times a week for a duration of 6-12 weeks. Proper technique shown and discussed handout in great detail with ATC.  All questions were discussed and answered.    Pertinent previous records reviewed include family medicine note 04/14/2024   Follow Up: 4 weeks for reevaluation.  If no improvement or worsening of symptoms, could discuss physical therapy versus prednisone  course versus advanced imaging   Subjective:   I, Kathryn [name], am serving as a neurosurgeon for Doctor Morene [name]  Chief Complaint: right sided rib injury   HPI:   05/04/2024 Patient is a 43 year old female with rib pain. Patient  states she fell a month ago in a bathtub. She landed on the edge of the tub. She was in [place]. She was seen by PCP and nothing was broken. She still has pain and would like to know how to get back to her ADLs. Last week she carried some boxes and flared her rib pain. Tylenol  PRN for the pain and that helps. Decreased ROM when doing certain activities.   Relevant Historical Information: None pertinent  Additional pertinent review of systems negative.  Current Medications[1]   Objective:     Vitals:   05/04/24 1533  Pulse: 71  SpO2: 98%  Weight: 126 lb (57.2 kg)  Height: 5' 4 (1.626 m)      Body mass index is 21.63 kg/m.    Physical Exam:    General: Well-appearing, cooperative, sitting comfortably in no acute distress.  HEENT: Normocephalic, atraumatic.   Neck: No gross abnormality.  Cardiovascular: No pallor or cyanosis. Resp: Comfortable WOB.  Right anterior rib pain with deep inhalation and exhalation, coughing.  No flail chest.  Mild TTP anterior right ribs 3-5 Abdomen: Non distended.   Skin: Warm and dry; no focal rashes identified on limited exam. Extremities: No cyanosis or edema.  Neuro: Gross motor and sensory intact. Gait normal. Psychiatric: Mood and affect are appropriate.    Electronically signed by:  Kathryn [name] D.CLEMENTEEN [name] 4:02 PM 05/04/2024     [1]  Current Outpatient Medications:    meloxicam  (MOBIC ) 15 MG tablet, Take 1 tablet daily for  2 weeks.  If still in pain after 2 weeks, take 1 tablet daily for an additional 1 week., Disp: 30 tablet, Rfl: 0   citalopram  (CELEXA ) 20 MG tablet, Take 20 mg by mouth at bedtime., Disp: , Rfl:    etonogestrel -ethinyl estradiol  (NUVARING) 0.12-0.015 MG/24HR vaginal ring, Place 1 each vaginally every 28 (twenty-eight) days. Insert vaginally and leave in place for 3 consecutive weeks, then remove for 1 week., Disp: , Rfl:    etonogestrel -ethinyl estradiol  (NUVARING) 0.12-0.015 MG/24HR vaginal  ring, INSERT 1 RING FOR 28 days; remove and replace on Day 28, Disp: 3 each, Rfl: 4   gabapentin  (NEURONTIN ) 100 MG capsule, Take 1 capsule (100 mg total) by mouth at bedtime., Disp: 90 capsule, Rfl: 0  "

## 2024-06-01 NOTE — Progress Notes (Unsigned)
 "               Kathryn [name] [name] [address] [place] [address] Phone: [phone]   Assessment and Plan:     1. Rib pain (Primary) 2. Contusion of rib, subsequent encounter -Subacute, improving, subsequent visit - Consistent with resolving contusion from right anterior rib cage from fall into bathtub edge.  Patient was having significant relief until recent diagnosis of influenza which likely exacerbated rib cage pain - Use meloxicam  15 mg daily as needed for breakthrough pain.  Recommend limiting chronic NSAIDs to 1-2 doses per week to prevent long-term side effects. Use Tylenol  500 to 1000 mg tablets 2-3 times a day as needed for day-to-day pain relief.    - Start prednisone  Dosepak - Restart HEP for rib cage, chest wall motion - Continue to avoid physical activity that worsens pain including high intensity activity     Pertinent previous records reviewed include none   Follow Up: 2 to 3 weeks for reevaluation.  Could advance physical therapy.  Could consider physical therapy versus MRI   Subjective:   I, Kathryn [name], am serving as a neurosurgeon for Doctor Morene Mace   Chief Complaint: right sided rib injury    HPI:    05/04/2024 Patient is a 44 year old female with rib pain. Patient states she fell a month ago in a bathtub. She landed on the edge of the tub. She was in [place]. She was seen by PCP and nothing was broken. She still has pain and would like to know how to get back to her ADLs. Last week she carried some boxes and flared her rib pain. Tylenol  PRN for the pain and that helps. Decreased ROM when doing certain activities.   06/02/2024 Patient states she is feeling better, not gone.she had the flu and that aggravated. Did a barre class and was sore after    Relevant Historical Information: None pertinent  Additional pertinent review of systems negative.  Current Medications[1]   Objective:     Vitals:   06/02/24 0934   Pulse: 73  SpO2: 98%  Weight: 126 lb (57.2 kg)  Height: 5' 4 (1.626 m)      Body mass index is 21.63 kg/m.    Physical Exam:    General: Well-appearing, cooperative, sitting comfortably in no acute distress.  HEENT: Normocephalic, atraumatic.   Neck: No gross abnormality.  Cardiovascular: No pallor or cyanosis. Resp: Comfortable WOB.  Right anterior rib pain with deep inhalation and exhalation, coughing.  No flail chest.  Minimal TTP anterior right ribs 3-5 Abdomen: Non distended.   Skin: Warm and dry; no focal rashes identified on limited exam. Extremities: No cyanosis or edema.  Neuro: Gross motor and sensory intact. Gait normal. Psychiatric: Mood and affect are appropriate.  Electronically signed by:  Kathryn [name] [name] 10:19 AM 06/02/24     [1]  Current Outpatient Medications:    methylPREDNISolone  (MEDROL  DOSEPAK) 4 MG TBPK tablet, Take 6 tablets on day 1.  Take 5 tablets on day 2.  Take 4 tablets on day 3.  Take 3 tablets on day 4.  Take 2 tablets on day 5.  Take 1 tablet on day 6., Disp: 21 tablet, Rfl: 0   citalopram  (CELEXA ) 20 MG tablet, Take 20 mg by mouth at bedtime., Disp: , Rfl:    etonogestrel -ethinyl estradiol  (NUVARING) 0.12-0.015 MG/24HR vaginal ring, Place 1 each vaginally every 28 (twenty-eight) days. Insert  vaginally and leave in place for 3 consecutive weeks, then remove for 1 week., Disp: , Rfl:    etonogestrel -ethinyl estradiol  (NUVARING) 0.12-0.015 MG/24HR vaginal ring, INSERT 1 RING FOR 28 days; remove and replace on Day 28, Disp: 3 each, Rfl: 4   gabapentin  (NEURONTIN ) 100 MG capsule, Take 1 capsule (100 mg total) by mouth at bedtime., Disp: 90 capsule, Rfl: 0   meloxicam  (MOBIC ) 15 MG tablet, Take 1 tablet daily for 2 weeks.  If still in pain after 2 weeks, take 1 tablet daily for an additional 1 week., Disp: 30 tablet, Rfl: 0  "

## 2024-06-02 ENCOUNTER — Ambulatory Visit: Admitting: Sports Medicine

## 2024-06-02 ENCOUNTER — Other Ambulatory Visit (HOSPITAL_BASED_OUTPATIENT_CLINIC_OR_DEPARTMENT_OTHER): Payer: Self-pay

## 2024-06-02 VITALS — HR 73 | Ht 64.0 in | Wt 126.0 lb

## 2024-06-02 DIAGNOSIS — S298XXD Other specified injuries of thorax, subsequent encounter: Secondary | ICD-10-CM

## 2024-06-02 DIAGNOSIS — R0789 Other chest pain: Secondary | ICD-10-CM | POA: Diagnosis not present

## 2024-06-02 MED ORDER — METHYLPREDNISOLONE 4 MG PO TBPK
ORAL_TABLET | ORAL | 0 refills | Status: AC
  Filled 2024-06-02: qty 21, 6d supply, fill #0

## 2024-06-02 NOTE — Patient Instructions (Signed)
 Prednisone  dos pak   Restart HEP   2-3 week follow up   Low intensity exercises

## 2024-06-08 ENCOUNTER — Other Ambulatory Visit (HOSPITAL_BASED_OUTPATIENT_CLINIC_OR_DEPARTMENT_OTHER): Payer: Self-pay

## 2024-06-16 NOTE — Progress Notes (Unsigned)
 "               Odis Mace D.[facility] [address] [place] [address] Phone: [phone]   Assessment and Plan:     1. Rib pain (Primary) 2. Contusion of rib, subsequent encounter -Chronic with exacerbation, subsequent visit - Overall improving right rib contusion from fall into bathtub edge 3 months ago.  Suspect recovery time has been prolonged due to influenza last month - Use meloxicam  15 mg daily as needed for breakthrough pain.  Recommend limiting chronic NSAIDs to 1-2 doses per week to prevent long-term side effects. Use Tylenol  500 to 1000 mg tablets 2-3 times a day as needed for day-to-day pain relief.    - Patient felt relief after completing prednisone  course.  Will not repeat prednisone  at this time -Recommend gradual return to physical activity.  Start activity at 50% (speed, duration, reps, sets, intensity) and allow 24 hours to assess for worsening pain.  If 50% is well-tolerated, may increase next activity to 75%.  If 75% is well-tolerated, may increase next physical activity to 100%.  If any of these levels cause pain, recommend dropping down to previous level for an additional 2-3 attempts before advancing. -Recommend aerobic activity such as stationary bike, elliptical    Pertinent previous records reviewed include none   Follow Up: As needed if no improvement or worsening of symptoms   Subjective:   I, Chestine Reeves, am serving as a neurosurgeon for Doctor Morene Mace   Chief Complaint: right sided rib injury    HPI:    05/04/2024 Patient is a 44 year old female with rib pain. Patient states she fell a month ago in a bathtub. She landed on the edge of the tub. She was in [place]. She was seen by PCP and nothing was broken. She still has pain and would like to know how to get back to her ADLs. Last week she carried some boxes and flared her rib pain. Tylenol  PRN for the pain and that helps. Decreased ROM when doing certain activities.     06/02/2024 Patient states she is feeling better, not gone.she had the flu and that aggravated. Did a barre class and was sore after   06/17/2024 Patient states she is feeling better. Stretching feels good now. Pain is still present but not as bad   Relevant Historical Information: None pertinent  Additional pertinent review of systems negative.  Current Medications[1]   Objective:     Vitals:   06/17/24 0901  Pulse: 64  SpO2: 100%  Weight: 126 lb (57.2 kg)  Height: 5' 4 (1.626 m)      Body mass index is 21.63 kg/m.    Physical Exam:    General: Well-appearing, cooperative, sitting comfortably in no acute distress.  HEENT: Normocephalic, atraumatic.   Neck: No gross abnormality.  Cardiovascular: No pallor or cyanosis. Resp: Comfortable WOB.  Minimal right anterior rib pain with deep inhalation and exhalation, coughing.  No flail chest.  Minimal TTP anterior right ribs 3-5 Abdomen: Non distended.   Skin: Warm and dry; no focal rashes identified on limited exam. Extremities: No cyanosis or edema.  Neuro: Gross motor and sensory intact. Gait normal. Psychiatric: Mood and affect are appropriate.    Electronically signed by:  Odis Mace [facility] 9:35 AM 06/17/24     [1]  Current Outpatient Medications:    citalopram  (CELEXA ) 20 MG tablet, Take 20 mg by mouth at bedtime., Disp: ,  Rfl:    etonogestrel -ethinyl estradiol  (NUVARING) 0.12-0.015 MG/24HR vaginal ring, Place 1 each vaginally every 28 (twenty-eight) days. Insert vaginally and leave in place for 3 consecutive weeks, then remove for 1 week., Disp: , Rfl:    etonogestrel -ethinyl estradiol  (NUVARING) 0.12-0.015 MG/24HR vaginal ring, INSERT 1 RING FOR 28 days; remove and replace on Day 28, Disp: 3 each, Rfl: 4   gabapentin  (NEURONTIN ) 100 MG capsule, Take 1 capsule (100 mg total) by mouth at bedtime., Disp: 90 capsule, Rfl: 0   meloxicam  (MOBIC ) 15 MG tablet, Take 1 tablet daily for 2  weeks.  If still in pain after 2 weeks, take 1 tablet daily for an additional 1 week., Disp: 30 tablet, Rfl: 0   methylPREDNISolone  (MEDROL  DOSEPAK) 4 MG TBPK tablet, Take 6 tablets on day 1.  Take 5 tablets on day 2.  Take 4 tablets on day 3.  Take 3 tablets on day 4.  Take 2 tablets on day 5.  Take 1 tablet on day 6., Disp: 21 tablet, Rfl: 0  "

## 2024-06-17 ENCOUNTER — Ambulatory Visit: Admitting: Sports Medicine

## 2024-06-17 VITALS — HR 64 | Ht 64.0 in | Wt 126.0 lb

## 2024-06-17 DIAGNOSIS — R0789 Other chest pain: Secondary | ICD-10-CM

## 2024-06-17 DIAGNOSIS — S298XXD Other specified injuries of thorax, subsequent encounter: Secondary | ICD-10-CM

## 2024-06-17 NOTE — Patient Instructions (Signed)
-   Use meloxicam  15 mg daily as needed for breakthrough pain.  Recommend limiting chronic NSAIDs to 1-2 doses per week to prevent long-term side effects. Use Tylenol  500 to 1000 mg tablets 2-3 times a day as needed for day-to-day pain relief.    -Recommend gradual return to physical activity.  Start activity at 50% (speed, duration, reps, sets, intensity) and allow 24 hours to assess for worsening pain.  If 50% is well-tolerated, may increase next activity to 75%.  If 75% is well-tolerated, may increase next physical activity to 100%.  If any of these levels cause pain, recommend dropping down to previous level for an additional 2-3 attempts before advancing.  Recommend elliptical or stationary bike   As needed follow up
